# Patient Record
Sex: Male | Born: 1960
Health system: Southern US, Community
[De-identification: ages and names within clinical notes are randomized; demographics above are authoritative.]

## PROBLEM LIST (undated history)

## (undated) DIAGNOSIS — E785 Hyperlipidemia, unspecified: Secondary | ICD-10-CM

## (undated) DIAGNOSIS — I10 Essential (primary) hypertension: Secondary | ICD-10-CM

## (undated) HISTORY — DX: Hyperlipidemia, unspecified: E78.5

## (undated) HISTORY — PX: TONSILLECTOMY: SUR1361

---

## 1999-08-01 ENCOUNTER — Emergency Department (HOSPITAL_COMMUNITY): Admission: EM | Admit: 1999-08-01 | Discharge: 1999-08-01 | Payer: Self-pay | Admitting: Emergency Medicine

## 2001-12-05 ENCOUNTER — Emergency Department (HOSPITAL_COMMUNITY): Admission: EM | Admit: 2001-12-05 | Discharge: 2001-12-05 | Payer: Self-pay

## 2018-08-03 DIAGNOSIS — I10 Essential (primary) hypertension: Secondary | ICD-10-CM | POA: Diagnosis not present

## 2018-08-03 DIAGNOSIS — Z6835 Body mass index (BMI) 35.0-35.9, adult: Secondary | ICD-10-CM | POA: Diagnosis not present

## 2018-08-03 DIAGNOSIS — M25571 Pain in right ankle and joints of right foot: Secondary | ICD-10-CM | POA: Diagnosis not present

## 2018-08-03 DIAGNOSIS — T7840XA Allergy, unspecified, initial encounter: Secondary | ICD-10-CM | POA: Diagnosis not present

## 2018-08-03 DIAGNOSIS — T782XXA Anaphylactic shock, unspecified, initial encounter: Secondary | ICD-10-CM | POA: Diagnosis not present

## 2018-08-03 DIAGNOSIS — T1490XA Injury, unspecified, initial encounter: Secondary | ICD-10-CM | POA: Diagnosis not present

## 2018-08-03 DIAGNOSIS — R531 Weakness: Secondary | ICD-10-CM | POA: Diagnosis not present

## 2018-08-03 DIAGNOSIS — S8261XA Displaced fracture of lateral malleolus of right fibula, initial encounter for closed fracture: Secondary | ICD-10-CM | POA: Diagnosis not present

## 2018-08-04 DIAGNOSIS — S8264XA Nondisplaced fracture of lateral malleolus of right fibula, initial encounter for closed fracture: Secondary | ICD-10-CM | POA: Diagnosis not present

## 2018-08-05 DIAGNOSIS — S8263XA Displaced fracture of lateral malleolus of unspecified fibula, initial encounter for closed fracture: Secondary | ICD-10-CM | POA: Insufficient documentation

## 2018-08-05 NOTE — Pre-Procedure Instructions (Signed)
NEVAEH CASILLAS  08/05/2018      [facility] - [address] [place] - [address] [place]  [address] Phone: [phone] Fax: [phone]    Your procedure is scheduled on Tues., Dec. 3, 2019 from 2:48PM-4:13PM  Report to [facility] Admitting Entrance "A" at 12:45PM  Call this number if you have problems the morning of surgery:  [phone]   Remember:  Do not eat or drink after midnight on Dec. 2nd    Take these medicines the morning of surgery with A SIP OF WATER: If needed: HYDROcodone-acetaminophen (NORCO/VICODIN) and Oxymetazoline (AFRIN)   As of today, stop taking all Other Aspirin Products, Vitamins, Fish oils, and Herbal medications. Also stop all NSAIDS i.e. Advil, Ibuprofen, Motrin, Aleve, Anaprox, Naproxen, BC, Goody Powders, and all Supplements.   Do not wear jewelry..  Do not wear lotions, powders, colognes, or deodorant.  Do not shave 48 hours prior to surgery.  Men may shave face.  Do not bring valuables to the hospital.  [facility] is not responsible for any belongings or valuables.  Contacts, dentures or bridgework may not be worn into surgery.  Leave your suitcase in the car.  After surgery it may be brought to your room.  For patients admitted to the hospital, discharge time will be determined by your treatment team.  Patients discharged the day of surgery will not be allowed to drive home.   Special instructions:   [place]- Preparing For Surgery  Before surgery, you can play an important role. Because skin is not sterile, your skin needs to be as free of germs as possible. You can reduce the number of germs on your skin by washing with CHG (chlorahexidine gluconate) Soap before surgery.  CHG is an antiseptic cleaner which kills germs and bonds with the skin to continue killing germs even after washing.    Oral Hygiene is also important to reduce your risk of infection.  Remember - BRUSH YOUR TEETH THE MORNING  OF SURGERY WITH YOUR REGULAR TOOTHPASTE  Please do not use if you have an allergy to CHG or antibacterial soaps. If your skin becomes reddened/irritated stop using the CHG.  Do not shave (including legs and underarms) for at least 48 hours prior to first CHG shower. It is OK to shave your face.  Please follow these instructions carefully.   1. Shower the NIGHT BEFORE SURGERY and the MORNING OF SURGERY with CHG.   2. If you chose to wash your hair, wash your hair first as usual with your normal shampoo.  3. After you shampoo, rinse your hair and body thoroughly to remove the shampoo.  4. Use CHG as you would any other liquid soap. You can apply CHG directly to the skin and wash gently with a scrungie or a clean washcloth.   5. Apply the CHG Soap to your body ONLY FROM THE NECK DOWN.  Do not use on open wounds or open sores. Avoid contact with your eyes, ears, mouth and genitals (private parts). Wash Face and genitals (private parts)  with your normal soap.  6. Wash thoroughly, paying special attention to the area where your surgery will be performed.  7. Thoroughly rinse your body with warm water from the neck down.  8. DO NOT shower/wash with your normal soap after using and rinsing off the CHG Soap.  9. Pat yourself dry with a CLEAN TOWEL.  10. Wear CLEAN PAJAMAS to bed the night  before surgery, wear comfortable clothes the morning of surgery  11. Place CLEAN SHEETS on your bed the night of your first shower and DO NOT SLEEP WITH PETS.  Day of Surgery:  Do not apply any deodorants/lotions.  Please wear clean clothes to the hospital/surgery center.   Remember to brush your teeth WITH YOUR REGULAR TOOTHPASTE.  Please read over the following fact sheets that you were given. Pain Booklet, Coughing and Deep Breathing, MRSA Information and Surgical Site Infection Prevention

## 2018-08-07 ENCOUNTER — Other Ambulatory Visit: Payer: Self-pay

## 2018-08-07 ENCOUNTER — Encounter (HOSPITAL_COMMUNITY)
Admission: RE | Admit: 2018-08-07 | Discharge: 2018-08-07 | Disposition: A | Discharge status: Home / Self Care | Payer: BLUE CROSS/BLUE SHIELD | Source: Ambulatory Visit | Attending: Orthopedic Surgery | Admitting: Orthopedic Surgery

## 2018-08-07 ENCOUNTER — Encounter (HOSPITAL_COMMUNITY): Payer: Self-pay

## 2018-08-07 DIAGNOSIS — I10 Essential (primary) hypertension: Secondary | ICD-10-CM | POA: Diagnosis not present

## 2018-08-07 DIAGNOSIS — Z79899 Other long term (current) drug therapy: Secondary | ICD-10-CM | POA: Diagnosis not present

## 2018-08-07 DIAGNOSIS — Z01818 Encounter for other preprocedural examination: Secondary | ICD-10-CM | POA: Insufficient documentation

## 2018-08-07 LAB — CBC
HEMATOCRIT: 47 % (ref 39.0–52.0)
HEMOGLOBIN: 15.5 g/dL (ref 13.0–17.0)
MCH: 30.9 pg (ref 26.0–34.0)
MCHC: 33 g/dL (ref 30.0–36.0)
MCV: 93.6 fL (ref 80.0–100.0)
Platelets: 198 10*3/uL (ref 150–400)
RBC: 5.02 MIL/uL (ref 4.22–5.81)
RDW: 12 % (ref 11.5–15.5)
WBC: 9 10*3/uL (ref 4.0–10.5)
nRBC: 0 % (ref 0.0–0.2)

## 2018-08-07 LAB — BASIC METABOLIC PANEL
Anion gap: 9 (ref 5–15)
BUN: 17 mg/dL (ref 6–20)
CHLORIDE: 106 mmol/L (ref 98–111)
CO2: 22 mmol/L (ref 22–32)
Calcium: 9.7 mg/dL (ref 8.9–10.3)
Creatinine, Ser: 0.98 mg/dL (ref 0.61–1.24)
GFR calc Af Amer: >60 mL/min (ref >60)
GFR calc non Af Amer: >60 mL/min (ref >60)
GLUCOSE: 112 mg/dL — AB (ref 70–99)
POTASSIUM: 4.1 mmol/L (ref 3.5–5.1)
Sodium: 137 mmol/L (ref 135–145)

## 2018-08-07 LAB — SURGICAL PCR SCREEN
MRSA, PCR: NEGATIVE
Staphylococcus aureus: NEGATIVE

## 2018-08-07 NOTE — Progress Notes (Signed)
PCP - denies Cardiologist -denies   Chest x-ray - N/A EKG - 08/07/18 Stress Test - denies ECHO - denies Cardiac Cath - denies  Sleep Study - denies  Aspirin Instructions: N/A  Anesthesia review: Yes, BP elevated at PAT. Levada Dy, [place] visited with patient and explained options regarding hypertension. Resources given to patient on following up with a PCP for BP control. Dr. Stann Mainland office to be notified.   Patient denies shortness of breath, fever, cough and chest pain at PAT appointment   Patient verbalized understanding of instructions that were given to them at the PAT appointment. Patient was also instructed that they will need to review over the PAT instructions again at home before surgery.

## 2018-08-10 MED ORDER — DEXTROSE 5 % IV SOLN
3.0000 g | INTRAVENOUS | Status: AC
  Administered 2018-08-11: 3 g via INTRAVENOUS
  Filled 2018-08-10: qty 3

## 2018-08-10 NOTE — Progress Notes (Signed)
Patient states that he was started on Lisinopril 10 mg BID and that his blood pressure has been 140-170/80's.  Notified Karoline Caldwell, [place].

## 2018-08-10 NOTE — Anesthesia Preprocedure Evaluation (Addendum)
Anesthesia Evaluation  Patient identified by MRN, date of birth, ID band Patient awake    Reviewed: Allergy & Precautions, NPO status , Patient's Chart, lab work & pertinent test results  History of Anesthesia Complications Negative for: history of anesthetic complications  Airway Mallampati: II  TM Distance: >3 FB Neck ROM: Full    Dental  (+) Teeth Intact, Dental Advisory Given   Pulmonary former smoker,    Pulmonary exam normal breath sounds clear to auscultation       Cardiovascular hypertension, Pt. on medications Normal cardiovascular exam Rhythm:Regular Rate:Normal     Neuro/Psych negative neurological ROS     GI/Hepatic Neg liver ROS, GERD  Medicated,  Endo/Other  BMI 34.2  Renal/GU negative Renal ROS     Musculoskeletal negative musculoskeletal ROS (+)   Abdominal   Peds  Hematology negative hematology ROS (+)   Anesthesia Other Findings Day of surgery medications reviewed with the patient.  Reproductive/Obstetrics                           Anesthesia Physical Anesthesia Plan  ASA: III  Anesthesia Plan: General   Post-op Pain Management: GA combined w/ Regional for post-op pain   Induction: Intravenous  PONV Risk Score and Plan: 2 and Treatment may vary due to age or medical condition, Ondansetron and Dexamethasone  Airway Management Planned: LMA  Additional Equipment:   Intra-op Plan:   Post-operative Plan: Extubation in OR  Informed Consent: I have reviewed the patients History and Physical, chart, labs and discussed the procedure including the risks, benefits and alternatives for the proposed anesthesia with the patient or authorized representative who has indicated his/her understanding and acceptance.   Dental advisory given  Plan Discussed with: CRNA  Anesthesia Plan Comments: (Markedly elevated BP at PAT, no PCP, denies previous diagnosis of HTN. Dr.  Stann Mainland made aware, he stated case needs to be done soon and to leave as scheduled. Since being seen at PAT pt has been started on lisinopril 10mg  BID and reports improved BP. He was instructed to take the lisinopril on DOS. Dr. Stann Mainland made aware case may be cancelled if BP is prohibitively high.  ETT if pt position needs to be other than supine.)     Anesthesia Quick Evaluation

## 2018-08-11 ENCOUNTER — Encounter (HOSPITAL_COMMUNITY)
Admission: RE | Disposition: A | Discharge status: Home / Self Care | Payer: Self-pay | Source: Ambulatory Visit | Attending: Orthopedic Surgery

## 2018-08-11 ENCOUNTER — Observation Stay (HOSPITAL_COMMUNITY)
Admission: RE | Admit: 2018-08-11 | Discharge: 2018-08-13 | Disposition: A | Discharge status: Home / Self Care | Payer: BLUE CROSS/BLUE SHIELD | Source: Ambulatory Visit | Attending: Orthopedic Surgery | Admitting: Orthopedic Surgery

## 2018-08-11 ENCOUNTER — Encounter (HOSPITAL_COMMUNITY): Payer: Self-pay | Admitting: Urology

## 2018-08-11 ENCOUNTER — Other Ambulatory Visit: Payer: Self-pay

## 2018-08-11 ENCOUNTER — Ambulatory Visit (HOSPITAL_COMMUNITY): Payer: BLUE CROSS/BLUE SHIELD

## 2018-08-11 ENCOUNTER — Ambulatory Visit (HOSPITAL_COMMUNITY): Payer: BLUE CROSS/BLUE SHIELD | Admitting: Emergency Medicine

## 2018-08-11 ENCOUNTER — Ambulatory Visit (HOSPITAL_COMMUNITY): Payer: BLUE CROSS/BLUE SHIELD | Admitting: Anesthesiology

## 2018-08-11 ENCOUNTER — Observation Stay (HOSPITAL_COMMUNITY): Payer: BLUE CROSS/BLUE SHIELD

## 2018-08-11 DIAGNOSIS — I10 Essential (primary) hypertension: Secondary | ICD-10-CM | POA: Insufficient documentation

## 2018-08-11 DIAGNOSIS — Z79899 Other long term (current) drug therapy: Secondary | ICD-10-CM | POA: Diagnosis not present

## 2018-08-11 DIAGNOSIS — W19XXXA Unspecified fall, initial encounter: Secondary | ICD-10-CM | POA: Diagnosis not present

## 2018-08-11 DIAGNOSIS — Z23 Encounter for immunization: Secondary | ICD-10-CM | POA: Insufficient documentation

## 2018-08-11 DIAGNOSIS — Z87891 Personal history of nicotine dependence: Secondary | ICD-10-CM | POA: Diagnosis not present

## 2018-08-11 DIAGNOSIS — S82841A Displaced bimalleolar fracture of right lower leg, initial encounter for closed fracture: Principal | ICD-10-CM | POA: Insufficient documentation

## 2018-08-11 DIAGNOSIS — S93431A Sprain of tibiofibular ligament of right ankle, initial encounter: Secondary | ICD-10-CM | POA: Insufficient documentation

## 2018-08-11 DIAGNOSIS — K219 Gastro-esophageal reflux disease without esophagitis: Secondary | ICD-10-CM | POA: Insufficient documentation

## 2018-08-11 DIAGNOSIS — Z419 Encounter for procedure for purposes other than remedying health state, unspecified: Secondary | ICD-10-CM

## 2018-08-11 DIAGNOSIS — Y929 Unspecified place or not applicable: Secondary | ICD-10-CM | POA: Insufficient documentation

## 2018-08-11 DIAGNOSIS — S82891A Other fracture of right lower leg, initial encounter for closed fracture: Secondary | ICD-10-CM | POA: Diagnosis present

## 2018-08-11 DIAGNOSIS — S8261XA Displaced fracture of lateral malleolus of right fibula, initial encounter for closed fracture: Secondary | ICD-10-CM | POA: Diagnosis not present

## 2018-08-11 DIAGNOSIS — S82831D Other fracture of upper and lower end of right fibula, subsequent encounter for closed fracture with routine healing: Secondary | ICD-10-CM | POA: Diagnosis not present

## 2018-08-11 HISTORY — PX: ORIF ANKLE FRACTURE: SHX5408

## 2018-08-11 HISTORY — DX: Essential (primary) hypertension: I10

## 2018-08-11 LAB — CBC
HCT: 42.1 % (ref 39.0–52.0)
HEMOGLOBIN: 14.3 g/dL (ref 13.0–17.0)
MCH: 31.2 pg (ref 26.0–34.0)
MCHC: 34 g/dL (ref 30.0–36.0)
MCV: 91.7 fL (ref 80.0–100.0)
NRBC: 0 % (ref 0.0–0.2)
Platelets: 200 10*3/uL (ref 150–400)
RBC: 4.59 MIL/uL (ref 4.22–5.81)
RDW: 11.7 % (ref 11.5–15.5)
WBC: 11.6 10*3/uL — ABNORMAL HIGH (ref 4.0–10.5)

## 2018-08-11 LAB — CREATININE, SERUM
Creatinine, Ser: 0.87 mg/dL (ref 0.61–1.24)
GFR calc non Af Amer: >60 mL/min (ref >60)

## 2018-08-11 SURGERY — OPEN REDUCTION INTERNAL FIXATION (ORIF) ANKLE FRACTURE
Anesthesia: General | Site: Ankle | Laterality: Right

## 2018-08-11 MED ORDER — BUPIVACAINE HCL (PF) 0.25 % IJ SOLN
INTRAMUSCULAR | Status: AC
  Filled 2018-08-11: qty 30

## 2018-08-11 MED ORDER — ENSURE ENLIVE PO LIQD
237.0000 mL | Freq: Two times a day (BID) | ORAL | Status: DC
  Administered 2018-08-12 – 2018-08-13 (×4): 237 mL via ORAL

## 2018-08-11 MED ORDER — HYDRALAZINE HCL 20 MG/ML IJ SOLN: 10.0000 mg | Freq: Three times a day (TID) | INTRAMUSCULAR | Status: DC | PRN

## 2018-08-11 MED ORDER — BUPIVACAINE HCL (PF) 0.25 % IJ SOLN: INTRAMUSCULAR | Status: DC | PRN

## 2018-08-11 MED ORDER — PROPOFOL 10 MG/ML IV BOLUS
INTRAVENOUS | Status: DC | PRN
  Administered 2018-08-11: 200 mg via INTRAVENOUS

## 2018-08-11 MED ORDER — ACETAMINOPHEN 650 MG RE SUPP: 650.0000 mg | Freq: Four times a day (QID) | RECTAL | Status: DC | PRN

## 2018-08-11 MED ORDER — FENTANYL CITRATE (PF) 100 MCG/2ML IJ SOLN
50.0000 ug | INTRAMUSCULAR | Status: AC | PRN
  Administered 2018-08-11 (×2): 50 ug via INTRAVENOUS
  Filled 2018-08-11 (×2): qty 1

## 2018-08-11 MED ORDER — ONDANSETRON HCL 4 MG/2ML IJ SOLN
INTRAMUSCULAR | Status: DC | PRN
  Administered 2018-08-11: 4 mg via INTRAVENOUS

## 2018-08-11 MED ORDER — OXYCODONE HCL 5 MG/5ML PO SOLN: 5.0000 mg | Freq: Once | ORAL | Status: DC | PRN

## 2018-08-11 MED ORDER — 0.9 % SODIUM CHLORIDE (POUR BTL) OPTIME
TOPICAL | Status: DC | PRN
  Administered 2018-08-11: 1000 mL

## 2018-08-11 MED ORDER — LIDOCAINE 2% (20 MG/ML) 5 ML SYRINGE
INTRAMUSCULAR | Status: AC
  Filled 2018-08-11: qty 5

## 2018-08-11 MED ORDER — PROPOFOL 10 MG/ML IV BOLUS
INTRAVENOUS | Status: AC
  Filled 2018-08-11: qty 20

## 2018-08-11 MED ORDER — BUPIVACAINE-EPINEPHRINE (PF) 0.5% -1:200000 IJ SOLN
INTRAMUSCULAR | Status: DC | PRN
  Administered 2018-08-11: 10 mL via PERINEURAL
  Administered 2018-08-11: 20 mL via PERINEURAL

## 2018-08-11 MED ORDER — PHENYLEPHRINE 40 MCG/ML (10ML) SYRINGE FOR IV PUSH (FOR BLOOD PRESSURE SUPPORT)
PREFILLED_SYRINGE | INTRAVENOUS | Status: DC | PRN
  Administered 2018-08-11 (×2): 120 ug via INTRAVENOUS

## 2018-08-11 MED ORDER — ONDANSETRON HCL 4 MG/2ML IJ SOLN
INTRAMUSCULAR | Status: AC
  Filled 2018-08-11: qty 2

## 2018-08-11 MED ORDER — FENTANYL CITRATE (PF) 100 MCG/2ML IJ SOLN
INTRAMUSCULAR | Status: DC | PRN
  Administered 2018-08-11 (×3): 50 ug via INTRAVENOUS

## 2018-08-11 MED ORDER — ONDANSETRON HCL 4 MG/2ML IJ SOLN: 4.0000 mg | Freq: Four times a day (QID) | INTRAMUSCULAR | Status: DC | PRN

## 2018-08-11 MED ORDER — INFLUENZA VAC SPLIT QUAD 0.5 ML IM SUSY
0.5000 mL | PREFILLED_SYRINGE | INTRAMUSCULAR | Status: AC
  Administered 2018-08-12: 0.5 mL via INTRAMUSCULAR
  Filled 2018-08-11: qty 0.5

## 2018-08-11 MED ORDER — PROMETHAZINE HCL 25 MG/ML IJ SOLN: 6.2500 mg | INTRAMUSCULAR | Status: DC | PRN

## 2018-08-11 MED ORDER — GABAPENTIN 600 MG PO TABS
300.0000 mg | ORAL_TABLET | Freq: Three times a day (TID) | ORAL | Status: DC
  Administered 2018-08-11 – 2018-08-13 (×5): 300 mg via ORAL
  Filled 2018-08-11 (×5): qty 1

## 2018-08-11 MED ORDER — PANTOPRAZOLE SODIUM 40 MG PO TBEC
40.0000 mg | DELAYED_RELEASE_TABLET | Freq: Every day | ORAL | Status: DC
  Administered 2018-08-11 – 2018-08-13 (×3): 40 mg via ORAL
  Filled 2018-08-11 (×3): qty 1

## 2018-08-11 MED ORDER — MIDAZOLAM HCL 2 MG/2ML IJ SOLN
INTRAMUSCULAR | Status: AC
  Administered 2018-08-11: 1 mg via INTRAVENOUS
  Filled 2018-08-11: qty 2

## 2018-08-11 MED ORDER — METHOCARBAMOL 500 MG PO TABS
500.0000 mg | ORAL_TABLET | Freq: Four times a day (QID) | ORAL | Status: DC | PRN
  Administered 2018-08-12 – 2018-08-13 (×2): 500 mg via ORAL
  Filled 2018-08-11 (×2): qty 1

## 2018-08-11 MED ORDER — MIDAZOLAM HCL 2 MG/2ML IJ SOLN
1.0000 mg | Freq: Once | INTRAMUSCULAR | Status: AC
  Administered 2018-08-11: 1 mg via INTRAVENOUS

## 2018-08-11 MED ORDER — HYDROMORPHONE HCL 1 MG/ML IJ SOLN
1.0000 mg | INTRAMUSCULAR | Status: DC | PRN
  Administered 2018-08-13: 1 mg via INTRAVENOUS
  Filled 2018-08-11: qty 1

## 2018-08-11 MED ORDER — FENTANYL CITRATE (PF) 100 MCG/2ML IJ SOLN
INTRAMUSCULAR | Status: AC
  Administered 2018-08-11: 50 ug via INTRAVENOUS
  Filled 2018-08-11: qty 2

## 2018-08-11 MED ORDER — PHENYLEPHRINE 40 MCG/ML (10ML) SYRINGE FOR IV PUSH (FOR BLOOD PRESSURE SUPPORT)
PREFILLED_SYRINGE | INTRAVENOUS | Status: AC
  Filled 2018-08-11: qty 10

## 2018-08-11 MED ORDER — DEXAMETHASONE SODIUM PHOSPHATE 10 MG/ML IJ SOLN
INTRAMUSCULAR | Status: AC
  Filled 2018-08-11: qty 1

## 2018-08-11 MED ORDER — MIDAZOLAM HCL 2 MG/2ML IJ SOLN
INTRAMUSCULAR | Status: AC
  Filled 2018-08-11: qty 2

## 2018-08-11 MED ORDER — OXYCODONE HCL 5 MG PO TABS
5.0000 mg | ORAL_TABLET | ORAL | Status: DC | PRN
  Administered 2018-08-12 – 2018-08-13 (×3): 10 mg via ORAL
  Filled 2018-08-11 (×3): qty 2

## 2018-08-11 MED ORDER — METHOCARBAMOL 1000 MG/10ML IJ SOLN
500.0000 mg | Freq: Four times a day (QID) | INTRAVENOUS | Status: DC | PRN
  Filled 2018-08-11: qty 5

## 2018-08-11 MED ORDER — ACETAMINOPHEN 10 MG/ML IV SOLN: 1000.0000 mg | Freq: Once | INTRAVENOUS | Status: DC | PRN

## 2018-08-11 MED ORDER — LISINOPRIL 10 MG PO TABS
10.0000 mg | ORAL_TABLET | Freq: Two times a day (BID) | ORAL | Status: DC
  Administered 2018-08-11 – 2018-08-13 (×4): 10 mg via ORAL
  Filled 2018-08-11 (×4): qty 1

## 2018-08-11 MED ORDER — CELECOXIB 200 MG PO CAPS
200.0000 mg | ORAL_CAPSULE | Freq: Two times a day (BID) | ORAL | Status: DC
  Administered 2018-08-11 – 2018-08-13 (×4): 200 mg via ORAL
  Filled 2018-08-11 (×4): qty 1

## 2018-08-11 MED ORDER — LIDOCAINE HCL (CARDIAC) PF 100 MG/5ML IV SOSY
PREFILLED_SYRINGE | INTRAVENOUS | Status: DC | PRN
  Administered 2018-08-11: 100 mg via INTRAVENOUS

## 2018-08-11 MED ORDER — FENTANYL CITRATE (PF) 250 MCG/5ML IJ SOLN
INTRAMUSCULAR | Status: AC
  Filled 2018-08-11: qty 5

## 2018-08-11 MED ORDER — CHLORHEXIDINE GLUCONATE 4 % EX LIQD: 60.0000 mL | Freq: Once | CUTANEOUS | Status: DC

## 2018-08-11 MED ORDER — OXYCODONE HCL 5 MG PO TABS: 5.0000 mg | ORAL_TABLET | Freq: Once | ORAL | Status: DC | PRN

## 2018-08-11 MED ORDER — ENOXAPARIN SODIUM 30 MG/0.3ML ~~LOC~~ SOLN
30.0000 mg | SUBCUTANEOUS | Status: DC
  Administered 2018-08-13: 30 mg via SUBCUTANEOUS
  Filled 2018-08-11 (×2): qty 0.3

## 2018-08-11 MED ORDER — FENTANYL CITRATE (PF) 100 MCG/2ML IJ SOLN: 25.0000 ug | INTRAMUSCULAR | Status: DC | PRN

## 2018-08-11 MED ORDER — ACETAMINOPHEN 325 MG PO TABS: 650.0000 mg | ORAL_TABLET | Freq: Four times a day (QID) | ORAL | Status: DC | PRN

## 2018-08-11 MED ORDER — DEXAMETHASONE SODIUM PHOSPHATE 10 MG/ML IJ SOLN
INTRAMUSCULAR | Status: DC | PRN
  Administered 2018-08-11: 10 mg via INTRAVENOUS

## 2018-08-11 MED ORDER — ACETAMINOPHEN 500 MG PO TABS
1000.0000 mg | ORAL_TABLET | Freq: Four times a day (QID) | ORAL | Status: DC
  Administered 2018-08-11 – 2018-08-13 (×6): 1000 mg via ORAL
  Filled 2018-08-11 (×8): qty 2

## 2018-08-11 MED ORDER — SODIUM CHLORIDE 0.9 % IV SOLN
INTRAVENOUS | Status: DC | PRN
  Administered 2018-08-11: 25 ug/min via INTRAVENOUS

## 2018-08-11 MED ORDER — ONDANSETRON HCL 4 MG PO TABS: 4.0000 mg | ORAL_TABLET | Freq: Four times a day (QID) | ORAL | Status: DC | PRN

## 2018-08-11 MED ORDER — LACTATED RINGERS IV SOLN
INTRAVENOUS | Status: DC
  Administered 2018-08-11 (×3): via INTRAVENOUS

## 2018-08-11 MED ORDER — FENTANYL CITRATE (PF) 100 MCG/2ML IJ SOLN
INTRAMUSCULAR | Status: AC
  Filled 2018-08-11: qty 2

## 2018-08-11 SURGICAL SUPPLY — 62 items
ALCOHOL 70% 16 OZ (MISCELLANEOUS) ×3 IMPLANT
BANDAGE ACE 4X5 VEL STRL LF (GAUZE/BANDAGES/DRESSINGS) ×2 IMPLANT
BANDAGE ACE 6X5 VEL STRL LF (GAUZE/BANDAGES/DRESSINGS) ×2 IMPLANT
BANDAGE ESMARK 6X9 LF (GAUZE/BANDAGES/DRESSINGS) IMPLANT
BIT DRILL 2.5 CANN LNG (BIT) ×2 IMPLANT
BIT DRILL 3.5 CANN STRL (BIT) ×2 IMPLANT
BNDG CMPR 9X6 STRL LF SNTH (GAUZE/BANDAGES/DRESSINGS) ×1
BNDG COHESIVE 4X5 TAN STRL (GAUZE/BANDAGES/DRESSINGS) ×3 IMPLANT
BNDG ESMARK 6X9 LF (GAUZE/BANDAGES/DRESSINGS) ×3
COVER SURGICAL LIGHT HANDLE (MISCELLANEOUS) ×3 IMPLANT
COVER WAND RF STERILE (DRAPES) ×3 IMPLANT
CUFF TOURNIQUET SINGLE 34IN LL (TOURNIQUET CUFF) ×2 IMPLANT
DRAPE C-ARM 42X72 X-RAY (DRAPES) ×3 IMPLANT
DRAPE C-ARMOR (DRAPES) ×3 IMPLANT
DRAPE U-SHAPE 47X51 STRL (DRAPES) ×3 IMPLANT
DRSG ADAPTIC 3X8 NADH LF (GAUZE/BANDAGES/DRESSINGS) ×3 IMPLANT
DRSG PAD ABDOMINAL 8X10 ST (GAUZE/BANDAGES/DRESSINGS) ×3 IMPLANT
DURAPREP 26ML APPLICATOR (WOUND CARE) ×5 IMPLANT
ELECT REM PT RETURN 9FT ADLT (ELECTROSURGICAL) ×3
ELECTRODE REM PT RTRN 9FT ADLT (ELECTROSURGICAL) ×1 IMPLANT
GAUZE SPONGE 4X4 12PLY STRL (GAUZE/BANDAGES/DRESSINGS) ×3 IMPLANT
GAUZE XEROFORM 1X8 LF (GAUZE/BANDAGES/DRESSINGS) ×2 IMPLANT
GLOVE BIO SURGEON STRL SZ7.5 (GLOVE) ×3 IMPLANT
GLOVE BIOGEL PI IND STRL 8 (GLOVE) ×1 IMPLANT
GLOVE BIOGEL PI INDICATOR 8 (GLOVE) ×2
GOWN STRL REUS W/ TWL LRG LVL3 (GOWN DISPOSABLE) ×2 IMPLANT
GOWN STRL REUS W/ TWL XL LVL3 (GOWN DISPOSABLE) ×1 IMPLANT
GOWN STRL REUS W/TWL LRG LVL3 (GOWN DISPOSABLE) ×6
GOWN STRL REUS W/TWL XL LVL3 (GOWN DISPOSABLE) ×6
IMPL TIGHTROP W/DRV K-LESS (Anchor) IMPLANT
IMPLANT TIGHTROPE W/DRV K-LESS (Anchor) ×3 IMPLANT
KIT BASIN OR (CUSTOM PROCEDURE TRAY) ×3 IMPLANT
KIT TURNOVER KIT B (KITS) ×3 IMPLANT
NDL HYPO 25GX1X1/2 BEV (NEEDLE) IMPLANT
NEEDLE HYPO 25GX1X1/2 BEV (NEEDLE) ×3 IMPLANT
NS IRRIG 1000ML POUR BTL (IV SOLUTION) ×3 IMPLANT
PACK ORTHO EXTREMITY (CUSTOM PROCEDURE TRAY) ×3 IMPLANT
PAD ABD 8X10 STRL (GAUZE/BANDAGES/DRESSINGS) ×4 IMPLANT
PAD ARMBOARD 7.5X6 YLW CONV (MISCELLANEOUS) ×6 IMPLANT
PAD CAST 4YDX4 CTTN HI CHSV (CAST SUPPLIES) ×2 IMPLANT
PADDING CAST COTTON 4X4 STRL (CAST SUPPLIES) ×6
PADDING CAST COTTON 6X4 STRL (CAST SUPPLIES) ×3 IMPLANT
PLATE THIRD TUBULAR 7 HOLE (Plate) ×2 IMPLANT
SCREW CANC T15 FT 16X4XST (Screw) IMPLANT
SCREW CANCELLOUS 4.0X16MM (Screw) ×3 IMPLANT
SCREW LOW PROFILE 3.5X14 (Screw) ×2 IMPLANT
SCREW NON LOCKING 3.5X26 ANKLE (Screw) ×2 IMPLANT
SCREW NON LOCKING 4.0X20 (Screw) ×2 IMPLANT
SCREW NON-LOCKING 3.5X12MM (Screw) ×4 IMPLANT
SPONGE LAP 18X18 X RAY DECT (DISPOSABLE) ×2 IMPLANT
SUCTION FRAZIER HANDLE 10FR (MISCELLANEOUS) ×2
SUCTION TUBE FRAZIER 10FR DISP (MISCELLANEOUS) ×1 IMPLANT
SUT ETHILON 3 0 PS 1 (SUTURE) ×5 IMPLANT
SUT VIC AB 0 CT1 27 (SUTURE) ×3
SUT VIC AB 0 CT1 27XBRD ANBCTR (SUTURE) IMPLANT
SUT VIC AB 2-0 CT1 27 (SUTURE) ×3
SUT VIC AB 2-0 CT1 TAPERPNT 27 (SUTURE) ×1 IMPLANT
SYR CONTROL 10ML LL (SYRINGE) ×2 IMPLANT
TOWEL OR 17X26 10 PK STRL BLUE (TOWEL DISPOSABLE) ×4 IMPLANT
TUBE CONNECTING 12'X1/4 (SUCTIONS) ×1
TUBE CONNECTING 12X1/4 (SUCTIONS) ×2 IMPLANT
YANKAUER SUCT BULB TIP NO VENT (SUCTIONS) ×3 IMPLANT

## 2018-08-11 NOTE — Anesthesia Procedure Notes (Signed)
Anesthesia Regional Block: Popliteal block   Pre-Anesthetic Checklist: ,, timeout performed, Correct Patient, Correct Site, Correct Laterality, Correct Procedure, Correct Position, site marked, Risks and benefits discussed, pre-op evaluation,  At surgeon's request and post-op pain management  Laterality: Right  Prep: Maximum Sterile Barrier Precautions used, chloraprep       Needles:  Injection technique: Single-shot  Needle Type: Echogenic Stimulator Needle     Needle Length: 9cm  Needle Gauge: 22     Additional Needles:   Procedures:,,,, ultrasound used (permanent image in chart),,,,  Narrative:  Start time: 08/11/2018 1:41 PM End time: 08/11/2018 1:43 PM Injection made incrementally with aspirations every 5 mL.  Performed by: Personally  Anesthesiologist: Brennan Bailey, MD  Additional Notes: Risks, benefits, and alternative discussed. Patient gave consent for procedure. Patient prepped and draped in sterile fashion. Sedation administered, patient remains easily responsive to voice. Relevant anatomy identified with ultrasound guidance. Local anesthetic given in 5cc increments with no signs or symptoms of intravascular injection. No pain or paraesthesias with injection. Patient monitored throughout procedure with signs of LAST or immediate complications. Tolerated well. Ultrasound image placed in chart.  Tawny Asal, MD

## 2018-08-11 NOTE — Anesthesia Procedure Notes (Signed)
Procedure Name: LMA Insertion Date/Time: 08/11/2018 2:49 PM Performed by: Jenne Campus, CRNA Pre-anesthesia Checklist: Patient identified, Emergency Drugs available, Suction available and Patient being monitored Patient Re-evaluated:Patient Re-evaluated prior to induction Oxygen Delivery Method: Circle System Utilized Preoxygenation: Pre-oxygenation with 100% oxygen Induction Type: IV induction Ventilation: Mask ventilation without difficulty LMA: LMA inserted LMA Size: 4.0 Number of attempts: 1 Airway Equipment and Method: Bite block Placement Confirmation: positive ETCO2 and breath sounds checked- equal and bilateral Tube secured with: Tape Dental Injury: Teeth and Oropharynx as per pre-operative assessment

## 2018-08-11 NOTE — Op Note (Signed)
Date of Surgery: 08/11/2018  INDICATIONS: Adrian Chaney is a 57 y.o.-year-old male who sustained a right ankle fracture; he was indicated for open reduction and internal fixation due to the displaced nature of the articular fracture and came to the operating room today for this procedure. The patient did consent to the procedure after discussion of the risks and benefits.  PREOPERATIVE DIAGNOSIS: right bimalleolar equivalent ankle fracture  POSTOPERATIVE DIAGNOSIS: Same.  PROCEDURE: 1. Open treatment of right ankle fracture with internal fixation. Lateral malleolar CPT [id number]; 2. Open reduction and internal fixation of distal tibia-fibular joint (syndesmosis), left 3. Intraoperative fluoroscopy 3 views with stress radiography.  SURGEON: Christropher Gintz P. Stann Mainland, M.D.  ASSIST: none.  ANESTHESIA:  general, popliteal block  TOURNIQUET TIME: 51 min. At 350 mm Hg  IV FLUIDS AND URINE: See anesthesia.  ESTIMATED BLOOD LOSS: 5 mL.  IMPLANTS:  Arthrex stainless steele 1/3 tubular 7 hole plate with cancellous and cortical screws One tightrope device for syndesmosis fixation, Arthrex  COMPLICATIONS: None.  DESCRIPTION OF PROCEDURE: The patient was brought to the operating room and placed supine on the operating table.  The patient had been signed prior to the procedure and this was documented. The patient had the anesthesia placed by the anesthesiologist.  A nonsterile tourniquet was placed on the upper thigh.  The prep verification and incision time-outs were performed to confirm that this was the correct patient, site, side and location. The patient had an SCD on the opposite lower extremity. The patient did receive antibiotics prior to the incision and was re-dosed during the procedure as needed at indicated intervals.  The patient had the lower extremity prepped and draped in the standard surgical fashion.  The extremity was exsanguinated using an esmarch bandage and the tourniquet was inflated to 350  mm Hg.   Incision was made over the distal fibula and the fracture was exposed and reduced anatomically with a clamp. A lag screw was placed. I then applied a 1/3 tubular locking plate and secured it proximally and distally with non-locking screws. Bone quality was good. I used c-arm to confirm satisfactory reduction and fixation.    The syndesmosis was stressed using live fluoroscopy and found to be un-stable.   We then moved to fix the syndesmosis.  Utlizing the mortise ankle xray the heel was bumped up off the bed and ankle flexed to neutral dorsiflexion.  The [facility] clamp was applied to the lateral plate and the medial malleolus.  This was tightened and confirmed to have held the syndesmosis reduced.  Following this, we moved to drill across the distal fibula and both cortices of the distal tibia.  This was done parallel to the joint line with the syndesmosis held reduced.  Next, we placed the tight rope device and flipped the medial button against the cortex of the medial tibia.  This was hand tightened again while elevating the heel off the table and monitoring for maintenance of the reduction.    Following this fixation final AP, Lateral and Mortise radiographs were taken and inerpreted by myself to confirm adequate reduction and placement of hardware.  The wounds were irrigated, and closed with vicryl with routine closure for the skin. The wounds were injected with local anesthetic. Sterile gauze was applied followed by a posterior splint. He was awakened and returned to the PACU in stable and satisfactory condition. There were no complications.    POSTOPERATIVE PLAN: Mr. Adrian Chaney will remain nonweightbearing on this leg for approximately 8 weeks; Mr.  Adrian Chaney will return for suture removal in 2 weeks.  He will be immobilized in a short leg splint and then transitioned to a CAM walker at his first follow up appointment.  Mr. Adrian Chaney will receive DVT prophylaxis based on other medications, activity  level, and risk ratio of bleeding to thrombosis.  Geralynn Rile, MD [facility] [phone] 4:13 PM

## 2018-08-11 NOTE — Brief Op Note (Signed)
08/11/2018  4:12 PM  PATIENT:  Adrian Chaney  57 y.o. male  PRE-OPERATIVE DIAGNOSIS:  Right ankle distal fibula fracture  POST-OPERATIVE DIAGNOSIS:  Right ankle distal fibula fracture  PROCEDURE:  Procedure(s) with comments: Open reduction internal fixation right ankle with possible syndesmosis fixation (Right) - 90 mins- OK to put to follow Stann Mainland per April  SURGEON:  Surgeon(s) and Role:    * Stann Mainland, Elly Modena, MD - Primary  PHYSICIAN ASSISTANT:   ASSISTANTS: none   ANESTHESIA:   regional and general  EBL:  5 mL   BLOOD ADMINISTERED:none  DRAINS: none   LOCAL MEDICATIONS USED:  NONE  SPECIMEN:  No Specimen  DISPOSITION OF SPECIMEN:  N/A  COUNTS:  YES  TOURNIQUET:   Total Tourniquet Time Documented: Thigh (Right) - 52 minutes Total: Thigh (Right) - 52 minutes   DICTATION: .Note written in EPIC  PLAN OF CARE: Admit for overnight observation  PATIENT DISPOSITION:  PACU - hemodynamically stable.   Delay start of Pharmacological VTE agent (>24hrs) due to surgical blood loss or risk of bleeding: not applicable

## 2018-08-11 NOTE — Anesthesia Postprocedure Evaluation (Signed)
Anesthesia Post Note  Patient: Adrian Chaney  Procedure(s) Performed: Open reduction internal fixation right ankle with possible syndesmosis fixation (Right Ankle)     Patient location during evaluation: PACU Anesthesia Type: General Level of consciousness: awake and alert Pain management: pain level controlled Vital Signs Assessment: post-procedure vital signs reviewed and stable Respiratory status: spontaneous breathing, nonlabored ventilation and respiratory function stable Cardiovascular status: blood pressure returned to baseline and stable Postop Assessment: no apparent nausea or vomiting Anesthetic complications: no    Last Vitals:  Vitals:   08/11/18 1625 08/11/18 1640  BP: (!) 178/89 (!) 161/84  Pulse: 80 87  Resp: 18 (!) 33  Temp: 36.8 C   SpO2: 97% 90%    Last Pain:  Vitals:   08/11/18 1625  TempSrc:   PainSc: 0-No pain                 Brennan Bailey

## 2018-08-11 NOTE — H&P (Signed)
ORTHOPAEDIC H and P  REQUESTING PHYSICIAN: Nicholes Stairs, MD  PCP:  Patient, No Pcp Per  Chief Complaint: right ankle fracture  HPI: Adrian Chaney is a 57 y.o. male who complains of right ankle pain following a fall last Tuesday morning.  He presents today for open reduction and internal fixation of the right ankle.  He complains of pain in the ankle but otherwise no numbness or tingling.  He has been dealing with some hypertension and has seen his PCP since our last office visit.  He states that he has been started on lisinopril.  He has no headache or vision changes to report today.  Past Medical History:  Diagnosis Date  . Hypertension    Past Surgical History:  Procedure Laterality Date  . TONSILLECTOMY     Social History   Socioeconomic History  . Marital status: Single    Spouse name: Not on file  . Number of children: Not on file  . Years of education: Not on file  . Highest education level: Not on file  Occupational History  . Not on file  Social Needs  . Financial resource strain: Not on file  . Food insecurity:    Worry: Not on file    Inability: Not on file  . Transportation needs:    Medical: Not on file    Non-medical: Not on file  Tobacco Use  . Smoking status: Former Research scientist (life sciences)  . Smokeless tobacco: Never Used  Substance and Sexual Activity  . Alcohol use: Not Currently    Alcohol/week: 1.0 - 2.0 standard drinks    Types: 1 - 2 Cans of beer per week  . Drug use: Yes    Frequency: 1.0 times per week    Types: Marijuana    Comment: 1 joint a week.   Marland Kitchen Sexual activity: Not on file  Lifestyle  . Physical activity:    Days per week: Not on file    Minutes per session: Not on file  . Stress: Not on file  Relationships  . Social connections:    Talks on phone: Not on file    Gets together: Not on file    Attends religious service: Not on file    Active member of club or organization: Not on file    Attends meetings of clubs or organizations:  Not on file    Relationship status: Not on file  Other Topics Concern  . Not on file  Social History Narrative  . Not on file   History reviewed. No pertinent family history. Allergies  Allergen Reactions  . Hydrocodone Nausea Only   Prior to Admission medications   Medication Sig Start Date End Date Taking? Authorizing Provider  Glucosamine Sulfate (FP GLUCOSAMINE PO) Take 1 capsule by mouth every morning.   Yes [provider]  HYDROcodone-acetaminophen (NORCO/VICODIN) 5-325 MG tablet Take 1 tablet by mouth every 6 (six) hours as needed for pain. 08/03/18  Yes [provider]  ibuprofen (ADVIL,MOTRIN) 200 MG tablet Take 600 mg by mouth every 6 (six) hours as needed (pain).   Yes [provider]  lisinopril (PRINIVIL,ZESTRIL) 10 MG tablet Take 10 mg by mouth 2 (two) times daily.   Yes [provider]  Multiple Vitamin (MULTIVITAMIN WITH MINERALS) TABS tablet Take 2 tablets by mouth daily.   Yes [provider]  Omega-3 Fatty Acids (FISH OIL) 1000 MG CAPS Take 1,000 mg by mouth 2 (two) times daily.   Yes [provider]  omeprazole (PRILOSEC) 10 MG capsule Take 10 mg by mouth daily.   Yes [provider]  OVER THE COUNTER MEDICATION Take 2 capsules by mouth every morning. Slim Tipton (speeds metabolism up)   Yes [provider]  oxymetazoline (AFRIN) 0.05 % nasal spray Place 1 spray into both nostrils daily as needed for congestion.   Yes [provider]  tetrahydrozoline-zinc (VISINE-AC) 0.05-0.25 % ophthalmic solution Place 2 drops into both eyes 3 (three) times daily as needed (dry eyes).   Yes [provider]   No results found.  Positive ROS: All other systems have been reviewed and were otherwise negative with the exception of those mentioned in the HPI and as above.  Physical Exam: General: Alert, no acute distress Cardiovascular: No pedal edema Respiratory: No cyanosis, no use of accessory  musculature GI: No organomegaly, abdomen is soft and non-tender Skin: No lesions in the area of chief complaint Neurologic: Sensation intact distally Psychiatric: Patient is competent for consent with normal mood and affect Lymphatic: No axillary or cervical lymphadenopathy    Assessment: Closed right ankle fracture.  Plan: -Plan for open reduction internal fixation of the right ankle today.  We again reviewed the risk, benefits, and indications of this procedure at length.  These include but are not limited to bleeding, infection, damage to surrounding neurovascular structures, development of blood clots, nonunion, malunion, painful hardware, need for further surgery, and the risk of anesthesia.  He has provided informed consent.  -Regarding his hypertension.  We will continue to monitor this preoperatively.  I will defer to the anesthesiologist as to his fitness for surgery.  -From a postoperative standpoint he will be admitted into the observation status postoperatively for routine elevation, physical therapy, and pain control.  He will discharge home tomorrow.    Nicholes Stairs, MD Cell [phone]    08/11/2018 12:20 PM

## 2018-08-11 NOTE — Discharge Instructions (Signed)
Postop instructions: -He should remain nonweightbearing to the right lower extremity.  He should elevate the right lower extremity with your "toes above nose."  At all times.  -Maintain your postoperative splint with your bandages clean and dry at all times.  -For the prevention of blood clots take a 325 mg aspirin once daily for 6 weeks postoperatively.  -For pain take Tylenol and Advil as needed every 6-8 hours.  He should also take the gabapentin 3 times a day for pain control.  For breakthrough pain take oxycodone as directed.  For nausea and/or vomiting take Zofran as directed.  -Return to see Dr. Stann Mainland in 2 weeks for wound check and splint exchange.

## 2018-08-11 NOTE — Anesthesia Procedure Notes (Signed)
Anesthesia Regional Block: Adductor canal block   Pre-Anesthetic Checklist: ,, timeout performed, Correct Patient, Correct Site, Correct Laterality, Correct Procedure, Correct Position, site marked, Risks and benefits discussed, pre-op evaluation,  At surgeon's request and post-op pain management  Laterality: Right  Prep: Maximum Sterile Barrier Precautions used, chloraprep       Needles:  Injection technique: Single-shot  Needle Type: Echogenic Stimulator Needle     Needle Length: 9cm  Needle Gauge: 22     Additional Needles:   Procedures:,,,, ultrasound used (permanent image in chart),,,,  Narrative:  Start time: 08/11/2018 1:39 PM End time: 08/11/2018 1:41 PM Injection made incrementally with aspirations every 5 mL.  Performed by: Personally  Anesthesiologist: Brennan Bailey, MD  Additional Notes: Risks, benefits, and alternative discussed. Patient gave consent for procedure. Patient prepped and draped in sterile fashion. Sedation administered, patient remains easily responsive to voice. Relevant anatomy identified with ultrasound guidance. Local anesthetic given in 5cc increments with no signs or symptoms of intravascular injection. No pain or paraesthesias with injection. Patient monitored throughout procedure with signs of LAST or immediate complications. Tolerated well. Ultrasound image placed in chart.  Tawny Asal, MD

## 2018-08-11 NOTE — Transfer of Care (Signed)
Immediate Anesthesia Transfer of Care Note  Patient: Adrian Chaney  Procedure(s) Performed: Open reduction internal fixation right ankle with possible syndesmosis fixation (Right Ankle)  Patient Location: PACU  Anesthesia Type:General  Level of Consciousness: awake, oriented and patient cooperative  Airway & Oxygen Therapy: Patient Spontanous Breathing and Patient connected to nasal cannula oxygen  Post-op Assessment: Report given to RN and Post -op Vital signs reviewed and stable  Post vital signs: Reviewed  Last Vitals:  Vitals Value Taken Time  BP    Temp    Pulse 83 08/11/2018  4:18 PM  Resp 15 08/11/2018  4:18 PM  SpO2 98 % 08/11/2018  4:18 PM  Vitals shown include unvalidated device data.  Last Pain:  Vitals:   08/11/18 1350  TempSrc:   PainSc: 0-No pain         Complications: No apparent anesthesia complications

## 2018-08-12 ENCOUNTER — Encounter (HOSPITAL_COMMUNITY): Payer: Self-pay | Admitting: Orthopedic Surgery

## 2018-08-12 DIAGNOSIS — S93431A Sprain of tibiofibular ligament of right ankle, initial encounter: Secondary | ICD-10-CM | POA: Diagnosis not present

## 2018-08-12 DIAGNOSIS — Z23 Encounter for immunization: Secondary | ICD-10-CM | POA: Diagnosis not present

## 2018-08-12 DIAGNOSIS — K219 Gastro-esophageal reflux disease without esophagitis: Secondary | ICD-10-CM | POA: Diagnosis not present

## 2018-08-12 DIAGNOSIS — Y929 Unspecified place or not applicable: Secondary | ICD-10-CM | POA: Diagnosis not present

## 2018-08-12 DIAGNOSIS — S82841A Displaced bimalleolar fracture of right lower leg, initial encounter for closed fracture: Secondary | ICD-10-CM | POA: Diagnosis not present

## 2018-08-12 DIAGNOSIS — Z87891 Personal history of nicotine dependence: Secondary | ICD-10-CM | POA: Diagnosis not present

## 2018-08-12 DIAGNOSIS — Z79899 Other long term (current) drug therapy: Secondary | ICD-10-CM | POA: Diagnosis not present

## 2018-08-12 DIAGNOSIS — I10 Essential (primary) hypertension: Secondary | ICD-10-CM | POA: Diagnosis not present

## 2018-08-12 DIAGNOSIS — W19XXXA Unspecified fall, initial encounter: Secondary | ICD-10-CM | POA: Diagnosis not present

## 2018-08-12 LAB — HIV ANTIBODY (ROUTINE TESTING W REFLEX): HIV Screen 4th Generation wRfx: NONREACTIVE

## 2018-08-12 NOTE — Plan of Care (Signed)
  Problem: Clinical Measurements: Goal: Ability to maintain clinical measurements within normal limits will improve Outcome: Progressing   Problem: Clinical Measurements: Goal: Will remain free from infection Outcome: Progressing   Problem: Pain Managment: Goal: General experience of comfort will improve Outcome: Progressing   Problem: Safety: Goal: Ability to remain free from injury will improve Outcome: Progressing   Problem: Skin Integrity: Goal: Risk for impaired skin integrity will decrease Outcome: Progressing

## 2018-08-12 NOTE — Plan of Care (Signed)
Problem: Education: Goal: Knowledge of General Education information will improve Description Including pain rating scale, medication(s)/side effects and non-pharmacologic comfort measures Outcome: Progressing   Problem: Health Behavior/Discharge Planning: Goal: Ability to manage health-related needs will improve Outcome: Progressing   Problem: Clinical Measurements: Goal: Respiratory complications will improve Outcome: Progressing   Problem: Nutrition: Goal: Adequate nutrition will be maintained Outcome: Progressing   Problem: Coping: Goal: Level of anxiety will decrease Outcome: Progressing   Problem: Pain Managment: Goal: General experience of comfort will improve Outcome: Progressing   Problem: Skin Integrity: Goal: Risk for impaired skin integrity will decrease Outcome: Progressing   

## 2018-08-12 NOTE — Evaluation (Signed)
Physical Therapy Evaluation Patient Details Name: Adrian Chaney MRN: [id number] DOB: 1961-07-02 Today's Date: 08/12/2018   History of Present Illness  57yo male who fell and was found to have closed right ankle fracture. He received an ORIF and possible syndesmosis fixation on 08/11/18. PMH HTN   Clinical Impression   Patient received in bed, very pleasant and willing to work with therapy but anxious regarding mobility and return home. Able to complete bed mobility with min guard to support surgical LE, transfers with RW and min guard, and gait approximately 15f in room with S and RW while maintaining NWB R LE. Did not progress gait due to lunch and other [facility] services having arrived (Data processing manager. Able to navigate single step with RW and S and also able to maintain NWB status well during stair navigation. He was left up in his chair with all needs met, visitor present, and chaplain attending. He will continue to benefit from skilled PT services in the acute setting as well as skilled HHPT services following DC.     Follow Up Recommendations Home health PT    Equipment Recommendations  Rolling walker with 5" wheels;3in1 (PT)    Recommendations for Other Services       Precautions / Restrictions Precautions Precautions: Fall Restrictions Weight Bearing Restrictions: Yes RLE Weight Bearing: Non weight bearing      Mobility  Bed Mobility Overal bed mobility: Needs Assistance Bed Mobility: Supine to Sit     Supine to sit: Min guard     General bed mobility comments: min guard to support R LE   Transfers Overall transfer level: Needs assistance Equipment used: Rolling walker (2 wheeled) Transfers: Sit to/from Stand Sit to Stand: Min guard         General transfer comment: min guard for safety, VC for hand placement and seqeuncing   Ambulation/Gait Ambulation/Gait assistance: Supervision Gait Distance (Feet): 10 Feet Assistive device: Rolling walker (2 wheeled) Gait  Pattern/deviations: Step-to pattern;Trunk flexed Gait velocity: decreased    General Gait Details: swing to pattern on L LE, R LE NWB; able to complete gait safely with RW and min guard; did not progress gait as lunch and chaplain had arrived   Stairs Stairs: Yes Stairs assistance: Supervision Stair Management: No rails;With walker Number of Stairs: 1 General stair comments: backwards ascent, forward descent with RW and S   Wheelchair Mobility    Modified Rankin (Stroke Patients Only)       Balance Overall balance assessment: Mild deficits observed, not formally tested                                           Pertinent Vitals/Pain Pain Assessment: No/denies pain    Home Living Family/patient expects to be discharged to:: Private residence Living Arrangements: Alone Available Help at Discharge: Neighbor;Available PRN/intermittently Type of Home: House Home Access: Stairs to enter Entrance Stairs-Rails: None Entrance Stairs-Number of Steps: two 1 step thresholds  Home Layout: One level Home Equipment: Crutches      Prior Function Level of Independence: Independent               Hand Dominance        Extremity/Trunk Assessment   Upper Extremity Assessment Upper Extremity Assessment: Generalized weakness    Lower Extremity Assessment Lower Extremity Assessment: Generalized weakness    Cervical / Trunk Assessment Cervical / Trunk Assessment: Normal  Communication   Communication: No difficulties  Cognition Arousal/Alertness: Awake/alert Behavior During Therapy: WFL for tasks assessed/performed Overall Cognitive Status: Within Functional Limits for tasks assessed                                        General Comments      Exercises     Assessment/Plan    PT Assessment Patient needs continued PT services  PT Problem List Decreased strength;Decreased range of motion;Decreased knowledge of use of DME;Decreased  activity tolerance;Decreased safety awareness;Decreased balance;Decreased mobility;Decreased coordination       PT Treatment Interventions DME instruction;Balance training;Gait training;Neuromuscular re-education;Stair training;Functional mobility training;Patient/family education;Therapeutic activities;Therapeutic exercise;Manual techniques    PT Goals (Current goals can be found in the Care Plan section)  Acute Rehab PT Goals Patient Stated Goal: feel better PT Goal Formulation: With patient Time For Goal Achievement: 08/26/18 Potential to Achieve Goals: Good    Frequency Min 4X/week   Barriers to discharge        Co-evaluation               AM-PAC PT "6 Clicks" Mobility  Outcome Measure Help needed turning from your back to your side while in a flat bed without using bedrails?: None Help needed moving from lying on your back to sitting on the side of a flat bed without using bedrails?: A Little Help needed moving to and from a bed to a chair (including a wheelchair)?: A Little Help needed standing up from a chair using your arms (e.g., wheelchair or bedside chair)?: A Little Help needed to walk in hospital room?: A Little Help needed climbing 3-5 steps with a railing? : A Little 6 Click Score: 19    End of Session Equipment Utilized During Treatment: Gait belt Activity Tolerance: Patient tolerated treatment well Patient left: in chair;with call bell/phone within reach;with family/visitor present   PT Visit Diagnosis: Muscle weakness (generalized) (M62.81);Difficulty in walking, not elsewhere classified (R26.2);History of falling (Z91.81)    Time: 3736-6815 PT Time Calculation (min) (ACUTE ONLY): 32 min   Charges:   PT Evaluation $PT Eval Low Complexity: 1 Low PT Treatments $Gait Training: 8-22 mins        Deniece Ree PT, DPT, CBIS  Supplemental Physical Therapist Force    Pager [phone] Acute Rehab Office [phone]

## 2018-08-12 NOTE — Progress Notes (Signed)
Chaplain responded to spiritual consult.  Pt possibly interested in Advanced Directive.  Chaplain explained AD to pt and his brother.  Will be available should the patient be interested in completing it. Tamsen Snider Pager [phone]

## 2018-08-12 NOTE — Progress Notes (Signed)
Nutrition Brief Note  Patient identified on the Malnutrition Screening Tool (MST) Report. Patient reports a little weight loss recently due to taking a "fluid pill." He has been eating well. No nutrition concerns at this time.   Wt Readings from Last 15 Encounters:  08/11/18 120.7 kg  08/07/18 120.7 kg    Body mass index is 34.16 kg/m. Patient meets criteria for obesity based on current BMI.   Current diet order is regular, patient is consuming approximately 100% of meals at this time. Labs and medications reviewed.   No nutrition interventions warranted at this time. If nutrition issues arise, please consult RD.   Molli Barrows, RD, LDN, [place] Pager [phone] After Hours Pager [phone]

## 2018-08-12 NOTE — Progress Notes (Signed)
   Subjective:  Patient reports pain as mild.  Block still in place currently.  Denies SOB, HA n/v or CP  Objective:   VITALS:   Vitals:   08/11/18 1834 08/11/18 1952 08/11/18 2300 08/12/18 0510  BP:  (!) 166/91 (!) 161/82 (!) 145/80  Pulse:  88  86  Resp:  18  20  Temp:  98.5 F (36.9 C)  98.6 F (37 C)  TempSrc:  Oral  Oral  SpO2:  95%  100%  Weight: 120.7 kg     Height: 6\' 2"  (1.88 m)       Intact pulses distally Dorsiflexion/Plantar flexion intact No cellulitis present Compartment soft Sensation absent in toes due to block.  No pain with passive stretch  Lab Results  Component Value Date   WBC 11.6 (H) 08/11/2018   HGB 14.3 08/11/2018   HCT 42.1 08/11/2018   MCV 91.7 08/11/2018   PLT 200 08/11/2018   BMET    Component Value Date/Time   NA 137 08/07/2018 1134   K 4.1 08/07/2018 1134   CL 106 08/07/2018 1134   CO2 22 08/07/2018 1134   GLUCOSE 112 (H) 08/07/2018 1134   BUN 17 08/07/2018 1134   CREATININE 0.87 08/11/2018 1906   CALCIUM 9.7 08/07/2018 1134   GFRNONAA >60 08/11/2018 1906   GFRAA >60 08/11/2018 1906     Assessment/Plan: 1 Day Post-Op   Active Problems:   Ankle fracture, right   Closed right ankle fracture   Up with therapy NWB LLE Will monitor pain control once block is off to ensure able to dc home Monitor BP.  Currently on home meds with PRN for SBP > 200 or DBP > 110 - strict elevation to RLE - will dc home when able   Nicholes Stairs 08/12/2018, 7:26 AM   Geralynn Rile, MD [phone]

## 2018-08-13 DIAGNOSIS — Y929 Unspecified place or not applicable: Secondary | ICD-10-CM | POA: Diagnosis not present

## 2018-08-13 DIAGNOSIS — Z87891 Personal history of nicotine dependence: Secondary | ICD-10-CM | POA: Diagnosis not present

## 2018-08-13 DIAGNOSIS — Z79899 Other long term (current) drug therapy: Secondary | ICD-10-CM | POA: Diagnosis not present

## 2018-08-13 DIAGNOSIS — K219 Gastro-esophageal reflux disease without esophagitis: Secondary | ICD-10-CM | POA: Diagnosis not present

## 2018-08-13 DIAGNOSIS — S82841A Displaced bimalleolar fracture of right lower leg, initial encounter for closed fracture: Secondary | ICD-10-CM | POA: Diagnosis not present

## 2018-08-13 DIAGNOSIS — S93431A Sprain of tibiofibular ligament of right ankle, initial encounter: Secondary | ICD-10-CM | POA: Diagnosis not present

## 2018-08-13 DIAGNOSIS — W19XXXA Unspecified fall, initial encounter: Secondary | ICD-10-CM | POA: Diagnosis not present

## 2018-08-13 DIAGNOSIS — Z23 Encounter for immunization: Secondary | ICD-10-CM | POA: Diagnosis not present

## 2018-08-13 DIAGNOSIS — I10 Essential (primary) hypertension: Secondary | ICD-10-CM | POA: Diagnosis not present

## 2018-08-13 MED ORDER — GABAPENTIN 600 MG PO TABS: 300.0000 mg | ORAL_TABLET | Freq: Three times a day (TID) | ORAL | 1 refills | Status: DC

## 2018-08-13 MED ORDER — ONDANSETRON HCL 4 MG PO TABS: 4.0000 mg | ORAL_TABLET | Freq: Four times a day (QID) | ORAL | 0 refills | Status: DC | PRN

## 2018-08-13 MED ORDER — OXYCODONE HCL 5 MG PO TABS: 5.0000 mg | ORAL_TABLET | ORAL | 0 refills | Status: DC | PRN

## 2018-08-13 NOTE — Progress Notes (Signed)
Patient discharged home on home health. Discharge instruction provided to patient, all questions answered and concern addressed. Patient verbalized understanding.

## 2018-08-13 NOTE — Progress Notes (Signed)
   Subjective:  Patient reports pain as mild.  Block has resolved and he has had good pain control with oral medications throughout the day today denies SOB, HA n/v or CP  Objective:   VITALS:   Vitals:   08/12/18 1421 08/12/18 2039 08/12/18 2209 08/13/18 0628  BP: (!) 165/85 (!) 165/95 (!) 159/88 (!) 175/95  Pulse: 83  66 66  Resp: 20  18   Temp: 97.8 F (36.6 C)  98.1 F (36.7 C) 98.1 F (36.7 C)  TempSrc: Oral  Oral Oral  SpO2: 96%  98% 97%  Weight:      Height:        Intact pulses distally Dorsiflexion/Plantar flexion intact No cellulitis present Compartment soft Sensation and motor intact throughout with no deficits.  No pain with passive stretch  Lab Results  Component Value Date   WBC 11.6 (H) 08/11/2018   HGB 14.3 08/11/2018   HCT 42.1 08/11/2018   MCV 91.7 08/11/2018   PLT 200 08/11/2018   BMET    Component Value Date/Time   NA 137 08/07/2018 1134   K 4.1 08/07/2018 1134   CL 106 08/07/2018 1134   CO2 22 08/07/2018 1134   GLUCOSE 112 (H) 08/07/2018 1134   BUN 17 08/07/2018 1134   CREATININE 0.87 08/11/2018 1906   CALCIUM 9.7 08/07/2018 1134   GFRNONAA >60 08/11/2018 1906   GFRAA >60 08/11/2018 1906     Assessment/Plan: 2 Days Post-Op   Active Problems:   Ankle fracture, right   Closed right ankle fracture   Up with therapy NWB LLE -PT is recommending home health PT which we will order. -Discharge home today. -Maintain splint and nonweightbearing strictly.  We will follow-up with me in 2 weeks.   Nicholes Stairs 08/13/2018, 1:31 PM   Geralynn Rile, MD [phone]

## 2018-08-13 NOTE — Progress Notes (Signed)
Physical Therapy Treatment Patient Details Name: Adrian Chaney MRN: [id number] DOB: 1960-10-16 Today's Date: 08/13/2018    History of Present Illness 57yo male who fell and was found to have closed right ankle fracture. He received an ORIF and possible syndesmosis fixation on 08/11/18. PMH HTN     PT Comments    Patient seen for mobility progression. Patient received IV pain medicine prior to mobilizing so increased time needed sitting EOB due to nausea and lightheadedness that passed prior to standing. Pt is able to ambulate 50 ft with RW and maintains NWB status with supervision. Min guard needed for safe functional transfers. Pt educated on uses of 3 in 1. Continue to progress as tolerated.    Follow Up Recommendations  Home health PT     Equipment Recommendations  Rolling walker with 5" wheels;3in1 (PT)    Recommendations for Other Services       Precautions / Restrictions Precautions Precautions: Fall Restrictions Weight Bearing Restrictions: Yes RLE Weight Bearing: Non weight bearing    Mobility  Bed Mobility Overal bed mobility: Modified Independent Bed Mobility: Supine to Sit           General bed mobility comments: increased time and effort  Transfers Overall transfer level: Needs assistance Equipment used: Rolling walker (2 wheeled) Transfers: Sit to/from Stand Sit to Stand: Min guard         General transfer comment: cues for safe hand placement; min guard for safety  Ambulation/Gait Ambulation/Gait assistance: Supervision Gait Distance (Feet): 50 Feet Assistive device: Rolling walker (2 wheeled) Gait Pattern/deviations: Step-to pattern;Trunk flexed Gait velocity: decreased    General Gait Details: L shoe donned; slow, steady gait and demonstrates safe use of AD   Stairs             Wheelchair Mobility    Modified Rankin (Stroke Patients Only)       Balance Overall balance assessment: Mild deficits observed, not formally tested                                           Cognition Arousal/Alertness: Awake/alert Behavior During Therapy: WFL for tasks assessed/performed Overall Cognitive Status: Within Functional Limits for tasks assessed                                        Exercises      General Comments General comments (skin integrity, edema, etc.): pt given IV dilaudid prior to standing and became nauseous which passed after ~5 minutes      Pertinent Vitals/Pain Pain Assessment: 0-10 Pain Score: 8  Pain Location: R ankle in dependent position; pt reports 5 when elevated Pain Descriptors / Indicators: Aching;Throbbing Pain Intervention(s): Limited activity within patient's tolerance;Monitored during session;Repositioned;Patient requesting pain meds-RN notified;RN gave pain meds during session    Home Living                      Prior Function            PT Goals (current goals can now be found in the care plan section) Acute Rehab PT Goals Patient Stated Goal: feel better Progress towards PT goals: Progressing toward goals    Frequency    Min 4X/week      PT Plan Current plan remains appropriate  Co-evaluation              AM-PAC PT "6 Clicks" Mobility   Outcome Measure  Help needed turning from your back to your side while in a flat bed without using bedrails?: None Help needed moving from lying on your back to sitting on the side of a flat bed without using bedrails?: A Little Help needed moving to and from a bed to a chair (including a wheelchair)?: A Little Help needed standing up from a chair using your arms (e.g., wheelchair or bedside chair)?: A Little Help needed to walk in hospital room?: A Little Help needed climbing 3-5 steps with a railing? : A Little 6 Click Score: 19    End of Session Equipment Utilized During Treatment: Gait belt Activity Tolerance: Patient tolerated treatment well Patient left: in chair;with call  bell/phone within reach Nurse Communication: Mobility status PT Visit Diagnosis: Muscle weakness (generalized) (M62.81);Difficulty in walking, not elsewhere classified (R26.2);History of falling (Z91.81)     Time: 9842-1031 PT Time Calculation (min) (ACUTE ONLY): 25 min  Charges:  $Gait Training: 8-22 mins $Therapeutic Activity: 8-22 mins                     Earney Navy, PTA Acute Rehabilitation Services Pager: [phone] Office: [phone]     Darliss Cheney 08/13/2018, 11:12 AM

## 2018-08-13 NOTE — Care Management Note (Signed)
Case Management Note  Patient Details  Name: Adrian Chaney MRN: [id number] Date of Birth: 1961-06-03  Subjective/Objective:  57 yr old gentleman s/p ORIF of right ankle.                  Action/Plan: Case manager spoke with patient concerning discharge plan and DME. Choice for Home Health agency was offered, referral was called to Ne[facility] Laming, [place] [facility]iaison. Patient says he lives alone but his brother lives near by as do other relatives.    Expected Discharge Date:  08/13/18               Expected Discharge Plan:  [place]  In-House Referral:  NA  Discharge planning Services  CM Consult  Post Acute Care Choice:  Durable Medical Equipment, Home Health Choice offered to:  Patient  DME Arranged:  3-N-1, Walker rolling DME Agency:  [place]:  PT [facility] Agency:  [place]  Status of Service:  Completed, signed off  If discussed at [place] of Stay Meetings, dates discussed:    Additional Comments:  Ninfa Meeker, RN 08/13/2018, 2:33 PM

## 2018-08-13 NOTE — Plan of Care (Signed)
  Problem: Education: Goal: Knowledge of General Education information will improve Description Including pain rating scale, medication(s)/side effects and non-pharmacologic comfort measures Outcome: Adequate for Discharge   Problem: Health Behavior/Discharge Planning: Goal: Ability to manage health-related needs will improve Outcome: Adequate for Discharge   Problem: Clinical Measurements: Goal: Ability to maintain clinical measurements within normal limits will improve Outcome: Adequate for Discharge Goal: Will remain free from infection Outcome: Adequate for Discharge Goal: Diagnostic test results will improve Outcome: Adequate for Discharge Goal: Respiratory complications will improve Outcome: Adequate for Discharge Goal: Cardiovascular complication will be avoided Outcome: Adequate for Discharge   Problem: Activity: Goal: Risk for activity intolerance will decrease Outcome: Adequate for Discharge   Problem: Nutrition: Goal: Adequate nutrition will be maintained Outcome: Adequate for Discharge   Problem: Coping: Goal: Level of anxiety will decrease Outcome: Adequate for Discharge   Problem: Elimination: Goal: Will not experience complications related to bowel motility Outcome: Adequate for Discharge Goal: Will not experience complications related to urinary retention Outcome: Adequate for Discharge   Problem: Pain Managment: Goal: General experience of comfort will improve Outcome: Adequate for Discharge   Problem: Safety: Goal: Ability to remain free from injury will improve Outcome: Adequate for Discharge   Problem: Skin Integrity: Goal: Risk for impaired skin integrity will decrease Outcome: Adequate for Discharge   Problem: Spiritual Needs Goal: Ability to function at adequate level Outcome: Adequate for Discharge

## 2018-08-13 NOTE — Plan of Care (Signed)
  Problem: Education: Goal: Knowledge of General Education information will improve Description Including pain rating scale, medication(s)/side effects and non-pharmacologic comfort measures Outcome: Progressing   Problem: Health Behavior/Discharge Planning: Goal: Ability to manage health-related needs will improve Outcome: Progressing   Problem: Clinical Measurements: Goal: Ability to maintain clinical measurements within normal limits will improve Outcome: Progressing Goal: Diagnostic test results will improve Outcome: Progressing   Problem: Activity: Goal: Risk for activity intolerance will decrease Outcome: Progressing   Problem: Elimination: Goal: Will not experience complications related to urinary retention Outcome: Progressing

## 2018-08-14 DIAGNOSIS — I10 Essential (primary) hypertension: Secondary | ICD-10-CM | POA: Diagnosis not present

## 2018-08-14 DIAGNOSIS — Z87891 Personal history of nicotine dependence: Secondary | ICD-10-CM | POA: Diagnosis not present

## 2018-08-14 DIAGNOSIS — S82841D Displaced bimalleolar fracture of right lower leg, subsequent encounter for closed fracture with routine healing: Secondary | ICD-10-CM | POA: Diagnosis not present

## 2018-08-14 DIAGNOSIS — Z9181 History of falling: Secondary | ICD-10-CM | POA: Diagnosis not present

## 2018-08-17 DIAGNOSIS — I10 Essential (primary) hypertension: Secondary | ICD-10-CM | POA: Diagnosis not present

## 2018-08-17 DIAGNOSIS — Z87891 Personal history of nicotine dependence: Secondary | ICD-10-CM | POA: Diagnosis not present

## 2018-08-17 DIAGNOSIS — S82841D Displaced bimalleolar fracture of right lower leg, subsequent encounter for closed fracture with routine healing: Secondary | ICD-10-CM | POA: Diagnosis not present

## 2018-08-17 DIAGNOSIS — Z9181 History of falling: Secondary | ICD-10-CM | POA: Diagnosis not present

## 2018-08-17 NOTE — Discharge Summary (Signed)
Patient ID: Adrian Chaney MRN: [id number] DOB/AGE: 56-Sep-1962 56 y.o.  Admit date: 08/11/2018 Discharge date: 08/13/2018  Primary Diagnosis: Right ankle fracture  Admission Diagnoses:  Past Medical History:  Diagnosis Date  . Hypertension    Discharge Diagnoses:   Active Problems:   Ankle fracture, right   Closed right ankle fracture  Estimated body mass index is 34.16 kg/m as calculated from the following:   Height as of this encounter: 6\' 2"  (1.88 m).   Weight as of this encounter: 120.7 kg.  Procedure:  Procedure(s) (LRB): Open reduction internal fixation right ankle with possible syndesmosis fixation (Right)   Consults: None  HPI: Mr. Adrian Chaney presented for operative fixation of his right ankle fracture. Laboratory Data: Admission on 08/11/2018, Discharged on 08/13/2018  Component Date Value Ref Range Status  . HIV Screen 4th Generation wRfx 08/11/2018 Non Reactive  Non Reactive Final   Comment: (NOTE) Performed At: [facility] [address] [place], [place] [id number] Rush Farmer MD IE:[id number]   . WBC 08/11/2018 11.6* 4.0 - 10.5 K/uL Final  . RBC 08/11/2018 4.59  4.22 - 5.81 MIL/uL Final  . Hemoglobin 08/11/2018 14.3  13.0 - 17.0 g/dL Final  . HCT 08/11/2018 42.1  39.0 - 52.0 % Final  . MCV 08/11/2018 91.7  80.0 - 100.0 fL Final  . MCH 08/11/2018 31.2  26.0 - 34.0 pg Final  . MCHC 08/11/2018 34.0  30.0 - 36.0 g/dL Final  . RDW 08/11/2018 11.7  11.5 - 15.5 % Final  . Platelets 08/11/2018 200  150 - 400 K/uL Final  . nRBC 08/11/2018 0.0  0.0 - 0.2 % Final   Performed at [facility], [place] [address], [place], [place] [address]  . Creatinine, Ser 08/11/2018 0.87  0.61 - 1.24 mg/dL Final  . GFR calc non Af Amer 08/11/2018 >60  >60 mL/min Final  . GFR calc Af Amer 08/11/2018 >60  >60 mL/min Final   Performed at [facility], [place] [address], [place], [place] [address]  Hospital Outpatient Visit on 08/07/2018  Component Date Value Ref Range  Status  . MRSA, PCR 08/07/2018 NEGATIVE  NEGATIVE Final  . Staphylococcus aureus 08/07/2018 NEGATIVE  NEGATIVE Final   Comment: (NOTE) The Xpert SA Assay (FDA approved for NASAL specimens in patients 28 years of age and older), is one component of a comprehensive surveillance program. It is not intended to diagnose infection nor to guide or monitor treatment. Performed at [facility], [place] [address] [place], [place] [address]   . Sodium 08/07/2018 137  135 - 145 mmol/L Final  . Potassium 08/07/2018 4.1  3.5 - 5.1 mmol/L Final  . Chloride 08/07/2018 106  98 - 111 mmol/L Final  . CO2 08/07/2018 22  22 - 32 mmol/L Final  . Glucose, Bld 08/07/2018 112* 70 - 99 mg/dL Final  . BUN 08/07/2018 17  6 - 20 mg/dL Final  . Creatinine, Ser 08/07/2018 0.98  0.61 - 1.24 mg/dL Final  . Calcium 08/07/2018 9.7  8.9 - 10.3 mg/dL Final  . GFR calc non Af Amer 08/07/2018 >60  >60 mL/min Final  . GFR calc Af Amer 08/07/2018 >60  >60 mL/min Final  . Anion gap 08/07/2018 9  5 - 15 Final   Performed at [facility], [place] [address] [place],  [address]  . WBC 08/07/2018 9.0  4.0 - 10.5 K/uL Final  . RBC 08/07/2018 5.02  4.22 - 5.81 MIL/uL Final  .  Hemoglobin 08/07/2018 15.5  13.0 - 17.0 g/dL Final  . HCT 08/07/2018 47.0  39.0 - 52.0 % Final  . MCV 08/07/2018 93.6  80.0 - 100.0 fL Final  . MCH 08/07/2018 30.9  26.0 - 34.0 pg Final  . MCHC 08/07/2018 33.0  30.0 - 36.0 g/dL Final  . RDW 08/07/2018 12.0  11.5 - 15.5 % Final  . Platelets 08/07/2018 198  150 - 400 K/uL Final  . nRBC 08/07/2018 0.0  0.0 - 0.2 % Final   Performed at [facility], [place] [address], [place], [place] [address]     X-Rays:Dg Ankle Complete Right  Result Date: 08/11/2018 CLINICAL DATA:  Distal fibular fracture ORIF. EXAM: DG C-ARM 61-120 MIN; RIGHT ANKLE - COMPLETE 3+ VIEW COMPARISON:  Right ankle x-rays dated August 03, 2018. FLUOROSCOPY TIME:  22 seconds. C-arm fluoroscopic images were obtained  intraoperatively and submitted for post operative interpretation. FINDINGS: Multiple intraoperative fluoroscopic images demonstrate interval lateral plate and screw fixation of the distal fibular fracture. Alignment is anatomic. No evidence of hardware failure or loosening. The ankle mortise is symmetric. The talar dome is intact. Expected soft tissue swelling. IMPRESSION: 1. Intraoperative fluoroscopic guidance for distal fibular fracture ORIF. Electronically Signed   By: Titus Dubin M.D.   On: 08/11/2018 16:16   Dg Ankle Right Port  Result Date: 08/11/2018 CLINICAL DATA:  History of ORIF of right ankle fracture EXAM: PORTABLE RIGHT ANKLE - 2 VIEW COMPARISON:  Right ankle C-arm films of 08/11/2017 and on ankle films of 08/03/2017 FINDINGS: Plate and screw fixation of the distal right fibular fracture has been performed. The tibiotalar articulation appears normal. A small metallic object overlies the distal right tibia medially of questionable significance. One loose screw is noted along the plate and screw fixation of the distal right fibula. IMPRESSION: . 1. Plate and screw fixation of the distal right fibular fracture with 1 screw loose adjacent to the fixation plate. 2. Tibiotalar articulation appears normal. Electronically Signed   By: Ivar Drape M.D.   On: 08/11/2018 17:00   Dg C-arm 1-60 Min  Result Date: 08/11/2018 CLINICAL DATA:  Distal fibular fracture ORIF. EXAM: DG C-ARM 61-120 MIN; RIGHT ANKLE - COMPLETE 3+ VIEW COMPARISON:  Right ankle x-rays dated August 03, 2018. FLUOROSCOPY TIME:  22 seconds. C-arm fluoroscopic images were obtained intraoperatively and submitted for post operative interpretation. FINDINGS: Multiple intraoperative fluoroscopic images demonstrate interval lateral plate and screw fixation of the distal fibular fracture. Alignment is anatomic. No evidence of hardware failure or loosening. The ankle mortise is symmetric. The talar dome is intact. Expected soft tissue  swelling. IMPRESSION: 1. Intraoperative fluoroscopic guidance for distal fibular fracture ORIF. Electronically Signed   By: Titus Dubin M.D.   On: 08/11/2018 16:16    EKG: Orders placed or performed during the hospital encounter of 08/07/18  . EKG 12 lead  . EKG 12 lead     Hospital Course: CROCKETT RALLO is a 57 y.o. who was admitted to Hospital. They were brought to the operating room on 08/11/2018 and underwent Procedure(s): Open reduction internal fixation right ankle with possible syndesmosis fixation.  Patient tolerated the procedure well and was later transferred to the recovery room and then to the orthopaedic floor for postoperative care.  They were given PO and IV analgesics for pain control following their surgery.  They were given 24 hours of postoperative antibiotics of  Anti-infectives (From admission, onward)   Start     Dose/Rate Route Frequency Ordered  Stop   08/11/18 1400  ceFAZolin (ANCEF) 3 g in dextrose 5 % 50 mL IVPB     3 g 100 mL/hr over 30 Minutes Intravenous To ShortStay Surgical 08/10/18 1247 08/11/18 1456     and started on DVT prophylaxis in the form of Aspirin.    Patient had a Good night on the evening of surgery.  They started to get up OOB with therapy on day one.   Continued to work with therapy into day two.    By day two, the patient had progressed with therapy and meeting their goals.  Incision was healing well.  Patient was seen in rounds and was ready to go home.   Diet: Regular diet Activity:NWB Follow-up:in 2 weeks Disposition - Home Discharged Condition: good   Discharge Instructions    Call MD / Call 911   Complete by:  As directed    If you experience chest pain or shortness of breath, CALL 911 and be transported to the hospital emergency room.  If you develope a fever above 101 F, pus (white drainage) or increased drainage or redness at the wound, or calf pain, call your surgeon's office.   Constipation Prevention   Complete by:  As  directed    Drink plenty of fluids.  Prune juice may be helpful.  You may use a stool softener, such as Colace (over the counter) 100 mg twice a day.  Use MiraLax (over the counter) for constipation as needed.   Diet - low sodium heart healthy   Complete by:  As directed    Face-to-face encounter (required for Medicare/Medicaid patients)   Complete by:  As directed    I Nicholes Stairs certify that this patient is under my care and that I, or a nurse practitioner or physician's assistant working with me, had a face-to-face encounter that meets the physician face-to-face encounter requirements with this patient on 08/13/2018. The encounter with the patient was in whole, or in part for the following medical condition(s) which is the primary reason for home health care (List medical condition):  Weakness Right ankle fracture   The encounter with the patient was in whole, or in part, for the following medical condition, which is the primary reason for home health care:  NWB and unable to leave home   I certify that, based on my findings, the following services are medically necessary home health services:  Physical therapy   Reason for Medically Necessary Home Health Services:  Therapy- Home Adaptation to Facilitate Safety   My clinical findings support the need for the above services:  Unable to leave home safely without assistance and/or assistive device   Further, I certify that my clinical findings support that this patient is homebound due to:  Unable to leave home safely without assistance   Home Health   Complete by:  As directed    To provide the following care/treatments:  PT   Increase activity slowly as tolerated   Complete by:  As directed      Allergies as of 08/13/2018   No Known Allergies     Medication List    STOP taking these medications   HYDROcodone-acetaminophen 5-325 MG tablet Commonly known as:  NORCO/VICODIN     TAKE these medications   Fish Oil 1000 MG  Caps Take 1,000 mg by mouth 2 (two) times daily.   FP GLUCOSAMINE PO Take 1 capsule by mouth every morning.   gabapentin 600 MG tablet Commonly known as:  NEURONTIN Take 0.5 tablets (300 mg total) by mouth 3 (three) times daily.   ibuprofen 200 MG tablet Commonly known as:  ADVIL,MOTRIN Take 600 mg by mouth every 6 (six) hours as needed (pain).   lisinopril 10 MG tablet Commonly known as:  PRINIVIL,ZESTRIL Take 10 mg by mouth 2 (two) times daily.   multivitamin with minerals Tabs tablet Take 2 tablets by mouth daily.   omeprazole 10 MG capsule Commonly known as:  PRILOSEC Take 10 mg by mouth daily.   ondansetron 4 MG tablet Commonly known as:  ZOFRAN Take 1 tablet (4 mg total) by mouth every 6 (six) hours as needed for nausea.   OVER THE COUNTER MEDICATION Take 2 capsules by mouth every morning. Slim Bayan (speeds metabolism up)   oxyCODONE 5 MG immediate release tablet Commonly known as:  Oxy IR/ROXICODONE Take 1-2 tablets (5-10 mg total) by mouth every 3 (three) hours as needed for breakthrough pain.   oxymetazoline 0.05 % nasal spray Commonly known as:  AFRIN Place 1 spray into both nostrils daily as needed for congestion.   tetrahydrozoline-zinc 0.05-0.25 % ophthalmic solution Commonly known as:  VISINE-AC Place 2 drops into both eyes 3 (three) times daily as needed (dry eyes).      Follow-up Information    Nicholes Stairs, MD In 2 weeks.   Specialty:  Orthopedic Surgery Why:  For suture removal Contact information: [address] [place] [address] [phone]        Home, [facility] Follow up.   Specialty:  [place] Why:  A representative from [facility] will contact you to arrange start date and time for your therapy. Contact information: [address] [place] [address] [phone]           Signed: Geralynn Rile, MD Orthopaedic Surgery 08/17/2018, 5:25 PM

## 2018-08-18 DIAGNOSIS — Z9181 History of falling: Secondary | ICD-10-CM | POA: Diagnosis not present

## 2018-08-18 DIAGNOSIS — Z87891 Personal history of nicotine dependence: Secondary | ICD-10-CM | POA: Diagnosis not present

## 2018-08-18 DIAGNOSIS — I10 Essential (primary) hypertension: Secondary | ICD-10-CM | POA: Diagnosis not present

## 2018-08-18 DIAGNOSIS — S82841D Displaced bimalleolar fracture of right lower leg, subsequent encounter for closed fracture with routine healing: Secondary | ICD-10-CM | POA: Diagnosis not present

## 2018-08-25 DIAGNOSIS — S8291XA Unspecified fracture of right lower leg, initial encounter for closed fracture: Secondary | ICD-10-CM | POA: Diagnosis not present

## 2018-08-25 DIAGNOSIS — Z4789 Encounter for other orthopedic aftercare: Secondary | ICD-10-CM | POA: Diagnosis not present

## 2018-09-01 DIAGNOSIS — S8264XD Nondisplaced fracture of lateral malleolus of right fibula, subsequent encounter for closed fracture with routine healing: Secondary | ICD-10-CM | POA: Diagnosis not present

## 2018-09-08 DIAGNOSIS — M25671 Stiffness of right ankle, not elsewhere classified: Secondary | ICD-10-CM | POA: Insufficient documentation

## 2018-09-10 DIAGNOSIS — M25671 Stiffness of right ankle, not elsewhere classified: Secondary | ICD-10-CM | POA: Diagnosis not present

## 2018-09-14 DIAGNOSIS — M25671 Stiffness of right ankle, not elsewhere classified: Secondary | ICD-10-CM | POA: Diagnosis not present

## 2018-09-16 DIAGNOSIS — M25671 Stiffness of right ankle, not elsewhere classified: Secondary | ICD-10-CM | POA: Diagnosis not present

## 2018-09-18 DIAGNOSIS — M25671 Stiffness of right ankle, not elsewhere classified: Secondary | ICD-10-CM | POA: Diagnosis not present

## 2018-09-21 DIAGNOSIS — M25671 Stiffness of right ankle, not elsewhere classified: Secondary | ICD-10-CM | POA: Diagnosis not present

## 2018-09-22 DIAGNOSIS — S82891D Other fracture of right lower leg, subsequent encounter for closed fracture with routine healing: Secondary | ICD-10-CM | POA: Diagnosis not present

## 2018-09-23 DIAGNOSIS — M25671 Stiffness of right ankle, not elsewhere classified: Secondary | ICD-10-CM | POA: Diagnosis not present

## 2018-09-25 DIAGNOSIS — M25671 Stiffness of right ankle, not elsewhere classified: Secondary | ICD-10-CM | POA: Diagnosis not present

## 2018-09-25 DIAGNOSIS — S8291XA Unspecified fracture of right lower leg, initial encounter for closed fracture: Secondary | ICD-10-CM | POA: Diagnosis not present

## 2018-09-28 DIAGNOSIS — M25671 Stiffness of right ankle, not elsewhere classified: Secondary | ICD-10-CM | POA: Diagnosis not present

## 2018-09-29 ENCOUNTER — Ambulatory Visit (INDEPENDENT_AMBULATORY_CARE_PROVIDER_SITE_OTHER): Payer: BLUE CROSS/BLUE SHIELD | Admitting: Family

## 2018-09-29 ENCOUNTER — Encounter: Payer: Self-pay | Admitting: Family

## 2018-09-29 VITALS — BP 167/100 | HR 79 | Temp 98.6°F | Ht 74.0 in | Wt 256.0 lb

## 2018-09-29 DIAGNOSIS — E669 Obesity, unspecified: Secondary | ICD-10-CM

## 2018-09-29 DIAGNOSIS — Z0000 Encounter for general adult medical examination without abnormal findings: Secondary | ICD-10-CM | POA: Diagnosis not present

## 2018-09-29 DIAGNOSIS — I10 Essential (primary) hypertension: Secondary | ICD-10-CM | POA: Diagnosis not present

## 2018-09-29 DIAGNOSIS — Z0001 Encounter for general adult medical examination with abnormal findings: Secondary | ICD-10-CM | POA: Diagnosis not present

## 2018-09-29 DIAGNOSIS — Z1211 Encounter for screening for malignant neoplasm of colon: Secondary | ICD-10-CM | POA: Diagnosis not present

## 2018-09-29 DIAGNOSIS — K219 Gastro-esophageal reflux disease without esophagitis: Secondary | ICD-10-CM

## 2018-09-29 MED ORDER — LISINOPRIL 40 MG PO TABS: 40.0000 mg | ORAL_TABLET | Freq: Every day | ORAL | 3 refills | Status: DC

## 2018-09-29 NOTE — Patient Instructions (Signed)

## 2018-09-29 NOTE — Progress Notes (Signed)
Subjective:    Patient ID: Adrian Chaney, male    DOB: 1961-06-20, 58 y.o.   MRN: [id number]  Chief Complaint  Patient presents with  . New Patient (Initial Visit)   PT presents to the office today to establish care and CPE. He fell in his yard in 07/2018 and broke his right ankle. He had surgery on 08/11/18. Hypertension  This is a new problem. The current episode started 1 to 4 weeks ago. The problem has been waxing and waning since onset. The problem is uncontrolled. Pertinent negatives include no headaches, peripheral edema or shortness of breath. Risk factors for coronary artery disease include obesity and male gender. Past treatments include ACE inhibitors. The current treatment provides mild improvement. There is no history of kidney disease or heart failure.  Gastroesophageal Reflux  He complains of heartburn. He reports no belching or no coughing. This is a chronic problem. The current episode started more than 1 year ago. The problem occurs occasionally. The symptoms are aggravated by certain foods. He has tried a PPI for the symptoms. The treatment provided moderate relief.      Review of Systems  Respiratory: Negative for cough and shortness of breath.   Gastrointestinal: Positive for heartburn.  Neurological: Negative for headaches.  All other systems reviewed and are negative.   Family History  Problem Relation Age of Onset  . Hypertension Mother   . Diabetes Father   . Cancer Father        colon  . Scoliosis Brother     Social History   Socioeconomic History  . Marital status: Single    Spouse name: Not on file  . Number of children: Not on file  . Years of education: Not on file  . Highest education level: Not on file  Occupational History  . Not on file  Social Needs  . Financial resource strain: Not on file  . Food insecurity:    Worry: Not on file    Inability: Not on file  . Transportation needs:    Medical: Not on file    Non-medical: Not on  file  Tobacco Use  . Smoking status: Former Research scientist (life sciences)  . Smokeless tobacco: Never Used  Substance and Sexual Activity  . Alcohol use: Not Currently    Alcohol/week: 1.0 - 2.0 standard drinks    Types: 1 - 2 Cans of beer per week  . Drug use: Yes    Frequency: 1.0 times per week    Types: Marijuana    Comment: 1 joint a week.   Marland Kitchen Sexual activity: Not on file  Lifestyle  . Physical activity:    Days per week: Not on file    Minutes per session: Not on file  . Stress: Not on file  Relationships  . Social connections:    Talks on phone: Not on file    Gets together: Not on file    Attends religious service: Not on file    Active member of club or organization: Not on file    Attends meetings of clubs or organizations: Not on file    Relationship status: Not on file  Other Topics Concern  . Not on file  Social History Narrative  . Not on file       Objective:   Physical Exam Vitals signs reviewed.  Constitutional:      General: He is not in acute distress.    Appearance: He is well-developed.  HENT:     Head:  Normocephalic.     Right Ear: Tympanic membrane normal.     Left Ear: Tympanic membrane normal.  Eyes:     General:        Right eye: No discharge.        Left eye: No discharge.     Pupils: Pupils are equal, round, and reactive to light.  Neck:     Musculoskeletal: Normal range of motion and neck supple.     Thyroid: No thyromegaly.  Cardiovascular:     Rate and Rhythm: Normal rate and regular rhythm.     Heart sounds: Normal heart sounds. No murmur.  Pulmonary:     Effort: Pulmonary effort is normal. No respiratory distress.     Breath sounds: Normal breath sounds. No wheezing.  Abdominal:     General: Bowel sounds are normal. There is no distension.     Palpations: Abdomen is soft.     Tenderness: There is no abdominal tenderness.  Musculoskeletal: Normal range of motion.        General: No tenderness.  Skin:    General: Skin is warm and dry.      Findings: No erythema or rash.  Neurological:     Mental Status: He is alert and oriented to person, place, and time.     Cranial Nerves: No cranial nerve deficit.     Deep Tendon Reflexes: Reflexes are normal and symmetric.  Psychiatric:        Behavior: Behavior normal.        Thought Content: Thought content normal.        Judgment: Judgment normal.       BP (!) 152/89   Pulse 79   Temp 98.6 F (37 C) (Oral)   Ht _0  (1.88 m)   Wt 256 lb (116.1 kg)   BMI 32.87 kg/m      Assessment & Plan:  KEITH FELTEN comes in today with chief complaint of New Patient (Initial Visit)   Diagnosis and orders addressed:  1. Annual physical exam - CMP14+EGFR - CBC with Differential/Platelet - Lipid panel - TSH - PSA, total and free - Hepatitis C antibody - Ambulatory referral to Gastroenterology  2. Essential hypertension Will increase Lisinopril to 40 mg from 20 mg  -Daily blood pressure log given with instructions on how to fill out and told to bring to next visit -Dash diet information given -Exercise encouraged - Stress Management  -Continue current meds -RTO in 2 weeks - CMP14+EGFR - CBC with Differential/Platelet - lisinopril (PRINIVIL,ZESTRIL) 40 MG tablet; Take 1 tablet (40 mg total) by mouth daily.  Dispense: 90 tablet; Refill: 3  3. Obesity (BMI 30-39.9) - CMP14+EGFR - CBC with Differential/Platelet  4. Colon cancer screening - CMP14+EGFR - CBC with Differential/Platelet - Ambulatory referral to Gastroenterology  5. Gastroesophageal reflux disease, esophagitis presence not specified   Labs pending Health Maintenance reviewed Diet and exercise encouraged  Follow up plan: 2 weeks to recheck HTN  Evelina Dun, FNP

## 2018-09-30 DIAGNOSIS — M25671 Stiffness of right ankle, not elsewhere classified: Secondary | ICD-10-CM | POA: Diagnosis not present

## 2018-09-30 LAB — CMP14+EGFR
ALT: 35 IU/L (ref 0–44)
AST: 21 IU/L (ref 0–40)
Albumin/Globulin Ratio: 2.3 — ABNORMAL HIGH (ref 1.2–2.2)
Albumin: 4.6 g/dL (ref 3.8–4.9)
Alkaline Phosphatase: 75 IU/L (ref 39–117)
BILIRUBIN TOTAL: 0.4 mg/dL (ref 0.0–1.2)
BUN / CREAT RATIO: 15 (ref 9–20)
BUN: 13 mg/dL (ref 6–24)
CHLORIDE: 102 mmol/L (ref 96–106)
CO2: 20 mmol/L (ref 20–29)
Calcium: 9.7 mg/dL (ref 8.7–10.2)
Creatinine, Ser: 0.84 mg/dL (ref 0.76–1.27)
GFR calc non Af Amer: 98 mL/min/{1.73_m2} (ref >59)
GFR, EST AFRICAN AMERICAN: 113 mL/min/{1.73_m2} (ref >59)
Globulin, Total: 2 g/dL (ref 1.5–4.5)
Glucose: 98 mg/dL (ref 65–99)
Potassium: 4.3 mmol/L (ref 3.5–5.2)
Sodium: 138 mmol/L (ref 134–144)
Total Protein: 6.6 g/dL (ref 6.0–8.5)

## 2018-09-30 LAB — LIPID PANEL
CHOLESTEROL TOTAL: 157 mg/dL (ref 100–199)
Chol/HDL Ratio: 4.9 ratio (ref 0.0–5.0)
HDL: 32 mg/dL — ABNORMAL LOW (ref >39)
LDL Calculated: 81 mg/dL (ref 0–99)
Triglycerides: 221 mg/dL — ABNORMAL HIGH (ref 0–149)
VLDL Cholesterol Cal: 44 mg/dL — ABNORMAL HIGH (ref 5–40)

## 2018-09-30 LAB — CBC WITH DIFFERENTIAL/PLATELET
Basophils Absolute: 0.1 10*3/uL (ref 0.0–0.2)
Basos: 1 %
EOS (ABSOLUTE): 0.1 10*3/uL (ref 0.0–0.4)
Eos: 2 %
Hematocrit: 44.5 % (ref 37.5–51.0)
Hemoglobin: 15.4 g/dL (ref 13.0–17.7)
Immature Grans (Abs): 0 10*3/uL (ref 0.0–0.1)
Immature Granulocytes: 0 %
LYMPHS ABS: 1.5 10*3/uL (ref 0.7–3.1)
Lymphs: 21 %
MCH: 31.2 pg (ref 26.6–33.0)
MCHC: 34.6 g/dL (ref 31.5–35.7)
MCV: 90 fL (ref 79–97)
Monocytes Absolute: 0.5 10*3/uL (ref 0.1–0.9)
Monocytes: 7 %
NEUTROS ABS: 4.7 10*3/uL (ref 1.4–7.0)
Neutrophils: 69 %
Platelets: 224 10*3/uL (ref 150–450)
RBC: 4.94 x10E6/uL (ref 4.14–5.80)
RDW: 11.8 % (ref 11.6–15.4)
WBC: 6.8 10*3/uL (ref 3.4–10.8)

## 2018-09-30 LAB — PSA, TOTAL AND FREE
PROSTATE SPECIFIC AG, SERUM: 2.6 ng/mL (ref 0.0–4.0)
PSA, Free Pct: 15.8 %
PSA, Free: 0.41 ng/mL

## 2018-09-30 LAB — TSH: TSH: 1.97 u[IU]/mL (ref 0.450–4.500)

## 2018-09-30 LAB — HEPATITIS C ANTIBODY: Hep C Virus Ab: <0.1 s/co ratio (ref 0.0–0.9)

## 2018-10-01 ENCOUNTER — Other Ambulatory Visit: Payer: Self-pay | Admitting: Family

## 2018-10-01 ENCOUNTER — Telehealth: Payer: Self-pay | Admitting: Family

## 2018-10-01 MED ORDER — ATORVASTATIN CALCIUM 20 MG PO TABS: 20.0000 mg | ORAL_TABLET | Freq: Every day | ORAL | 3 refills | Status: DC

## 2018-10-01 NOTE — Telephone Encounter (Signed)
Called and aware of labs

## 2018-10-02 DIAGNOSIS — M25671 Stiffness of right ankle, not elsewhere classified: Secondary | ICD-10-CM | POA: Diagnosis not present

## 2018-10-06 DIAGNOSIS — M25671 Stiffness of right ankle, not elsewhere classified: Secondary | ICD-10-CM | POA: Diagnosis not present

## 2018-10-09 DIAGNOSIS — M25671 Stiffness of right ankle, not elsewhere classified: Secondary | ICD-10-CM | POA: Diagnosis not present

## 2018-10-13 ENCOUNTER — Ambulatory Visit: Payer: BLUE CROSS/BLUE SHIELD | Admitting: Family

## 2018-10-14 DIAGNOSIS — M25671 Stiffness of right ankle, not elsewhere classified: Secondary | ICD-10-CM | POA: Diagnosis not present

## 2018-10-16 DIAGNOSIS — M25671 Stiffness of right ankle, not elsewhere classified: Secondary | ICD-10-CM | POA: Diagnosis not present

## 2018-10-20 DIAGNOSIS — M25671 Stiffness of right ankle, not elsewhere classified: Secondary | ICD-10-CM | POA: Diagnosis not present

## 2018-10-21 ENCOUNTER — Ambulatory Visit (INDEPENDENT_AMBULATORY_CARE_PROVIDER_SITE_OTHER): Payer: BLUE CROSS/BLUE SHIELD | Admitting: Family

## 2018-10-21 ENCOUNTER — Encounter: Payer: Self-pay | Admitting: Family

## 2018-10-21 VITALS — BP 125/78 | HR 89 | Temp 97.3°F | Ht 74.0 in | Wt 256.8 lb

## 2018-10-21 DIAGNOSIS — I10 Essential (primary) hypertension: Secondary | ICD-10-CM | POA: Diagnosis not present

## 2018-10-21 NOTE — Patient Instructions (Signed)

## 2018-10-21 NOTE — Progress Notes (Signed)
   Subjective:    Patient ID: Adrian Chaney, male    DOB: [birth year], 58 y.o.   MRN: [id number]  Chief Complaint  Patient presents with  . Hypertension   Pt presents to the office today to recheck HTN. PT's BP is at goal!! Hypertension  This is a chronic problem. The current episode started more than 1 year ago. The problem has been resolved since onset. The problem is controlled. Pertinent negatives include no anxiety, headaches, malaise/fatigue or shortness of breath. Past treatments include ACE inhibitors. The current treatment provides moderate improvement. There is no history of kidney disease, CAD/MI or heart failure.      Review of Systems  Constitutional: Negative for malaise/fatigue.  Respiratory: Negative for shortness of breath.   Neurological: Negative for headaches.  All other systems reviewed and are negative.      Objective:   Physical Exam Vitals signs reviewed.  Constitutional:      General: He is not in acute distress.    Appearance: He is well-developed.  HENT:     Head: Normocephalic.  Eyes:     General:        Right eye: No discharge.        Left eye: No discharge.     Pupils: Pupils are equal, round, and reactive to light.  Neck:     Musculoskeletal: Normal range of motion and neck supple.     Thyroid: No thyromegaly.  Cardiovascular:     Rate and Rhythm: Normal rate and regular rhythm.     Heart sounds: Normal heart sounds. No murmur.  Pulmonary:     Effort: Pulmonary effort is normal. No respiratory distress.     Breath sounds: Normal breath sounds. No wheezing.  Abdominal:     General: Bowel sounds are normal. There is no distension.     Palpations: Abdomen is soft.     Tenderness: There is no abdominal tenderness.  Skin:    General: Skin is warm and dry.     Findings: No erythema or rash.  Neurological:     Mental Status: He is alert and oriented to person, place, and time.     Cranial Nerves: No cranial nerve deficit.     Deep Tendon  Reflexes: Reflexes are normal and symmetric.  Psychiatric:        Behavior: Behavior normal.        Thought Content: Thought content normal.        Judgment: Judgment normal.       BP 125/78   Pulse 89   Temp (!) 97.3 F (36.3 C) (Oral)   Ht '6\' 2"'$  (1.88 m)   Wt 256 lb 12.8 oz (116.5 kg)   BMI 32.97 kg/m      Assessment & Plan:  Adrian Chaney comes in today with chief complaint of Hypertension   Diagnosis and orders addressed:  1. Essential hypertension -Dash diet information given -Exercise encouraged - Stress Management  -Continue current meds -RTO in 6 months - BMP8+EGFR   Evelina Dun, FNP

## 2018-10-22 LAB — BMP8+EGFR
BUN/Creatinine Ratio: 16 (ref 9–20)
BUN: 15 mg/dL (ref 6–24)
CO2: 20 mmol/L (ref 20–29)
Calcium: 9.6 mg/dL (ref 8.7–10.2)
Chloride: 104 mmol/L (ref 96–106)
Creatinine, Ser: 0.95 mg/dL (ref 0.76–1.27)
GFR calc Af Amer: 103 mL/min/{1.73_m2} (ref >59)
GFR calc non Af Amer: 89 mL/min/{1.73_m2} (ref >59)
Glucose: 133 mg/dL — ABNORMAL HIGH (ref 65–99)
Potassium: 4 mmol/L (ref 3.5–5.2)
Sodium: 141 mmol/L (ref 134–144)

## 2018-10-23 DIAGNOSIS — M25671 Stiffness of right ankle, not elsewhere classified: Secondary | ICD-10-CM | POA: Diagnosis not present

## 2018-10-26 DIAGNOSIS — S8291XA Unspecified fracture of right lower leg, initial encounter for closed fracture: Secondary | ICD-10-CM | POA: Diagnosis not present

## 2018-10-27 DIAGNOSIS — M25671 Stiffness of right ankle, not elsewhere classified: Secondary | ICD-10-CM | POA: Diagnosis not present

## 2018-10-27 DIAGNOSIS — Z4789 Encounter for other orthopedic aftercare: Secondary | ICD-10-CM | POA: Diagnosis not present

## 2018-10-30 DIAGNOSIS — M25671 Stiffness of right ankle, not elsewhere classified: Secondary | ICD-10-CM | POA: Diagnosis not present

## 2018-11-03 ENCOUNTER — Encounter: Payer: Self-pay | Admitting: Family

## 2018-11-03 DIAGNOSIS — M25671 Stiffness of right ankle, not elsewhere classified: Secondary | ICD-10-CM | POA: Diagnosis not present

## 2018-11-09 DIAGNOSIS — M25671 Stiffness of right ankle, not elsewhere classified: Secondary | ICD-10-CM | POA: Diagnosis not present

## 2018-11-13 DIAGNOSIS — M25671 Stiffness of right ankle, not elsewhere classified: Secondary | ICD-10-CM | POA: Diagnosis not present

## 2018-11-16 DIAGNOSIS — M25671 Stiffness of right ankle, not elsewhere classified: Secondary | ICD-10-CM | POA: Diagnosis not present

## 2018-11-19 DIAGNOSIS — M25671 Stiffness of right ankle, not elsewhere classified: Secondary | ICD-10-CM | POA: Diagnosis not present

## 2018-11-23 DIAGNOSIS — M25671 Stiffness of right ankle, not elsewhere classified: Secondary | ICD-10-CM | POA: Diagnosis not present

## 2018-11-24 DIAGNOSIS — S8291XA Unspecified fracture of right lower leg, initial encounter for closed fracture: Secondary | ICD-10-CM | POA: Diagnosis not present

## 2018-11-24 DIAGNOSIS — S8264XA Nondisplaced fracture of lateral malleolus of right fibula, initial encounter for closed fracture: Secondary | ICD-10-CM | POA: Diagnosis not present

## 2018-12-25 DIAGNOSIS — S8291XA Unspecified fracture of right lower leg, initial encounter for closed fracture: Secondary | ICD-10-CM | POA: Diagnosis not present

## 2019-01-18 ENCOUNTER — Telehealth: Payer: Self-pay | Admitting: Family

## 2019-01-24 DIAGNOSIS — S8291XA Unspecified fracture of right lower leg, initial encounter for closed fracture: Secondary | ICD-10-CM | POA: Diagnosis not present

## 2019-02-24 DIAGNOSIS — S8291XA Unspecified fracture of right lower leg, initial encounter for closed fracture: Secondary | ICD-10-CM | POA: Diagnosis not present

## 2019-03-26 DIAGNOSIS — S8291XA Unspecified fracture of right lower leg, initial encounter for closed fracture: Secondary | ICD-10-CM | POA: Diagnosis not present

## 2019-04-22 ENCOUNTER — Ambulatory Visit: Payer: BLUE CROSS/BLUE SHIELD | Admitting: Family

## 2019-04-22 ENCOUNTER — Other Ambulatory Visit: Payer: Self-pay

## 2019-04-26 DIAGNOSIS — S8291XA Unspecified fracture of right lower leg, initial encounter for closed fracture: Secondary | ICD-10-CM | POA: Diagnosis not present

## 2019-05-04 ENCOUNTER — Ambulatory Visit: Payer: BC Managed Care – PPO | Admitting: Family

## 2019-05-27 DIAGNOSIS — S8291XA Unspecified fracture of right lower leg, initial encounter for closed fracture: Secondary | ICD-10-CM | POA: Diagnosis not present

## 2019-06-07 ENCOUNTER — Ambulatory Visit (INDEPENDENT_AMBULATORY_CARE_PROVIDER_SITE_OTHER): Payer: BC Managed Care – PPO | Admitting: Family

## 2019-06-07 ENCOUNTER — Other Ambulatory Visit: Payer: Self-pay

## 2019-06-07 ENCOUNTER — Encounter: Payer: Self-pay | Admitting: Family

## 2019-06-07 VITALS — BP 159/91 | HR 74 | Temp 98.7°F | Ht 74.0 in | Wt 281.8 lb

## 2019-06-07 DIAGNOSIS — E669 Obesity, unspecified: Secondary | ICD-10-CM | POA: Diagnosis not present

## 2019-06-07 DIAGNOSIS — Z1211 Encounter for screening for malignant neoplasm of colon: Secondary | ICD-10-CM

## 2019-06-07 DIAGNOSIS — Z23 Encounter for immunization: Secondary | ICD-10-CM

## 2019-06-07 DIAGNOSIS — K219 Gastro-esophageal reflux disease without esophagitis: Secondary | ICD-10-CM | POA: Diagnosis not present

## 2019-06-07 DIAGNOSIS — I10 Essential (primary) hypertension: Secondary | ICD-10-CM

## 2019-06-07 NOTE — Patient Instructions (Signed)

## 2019-06-07 NOTE — Addendum Note (Signed)
Addended by: Shelbie Ammons on: 06/07/2019 03:05 PM   Modules accepted: Orders

## 2019-06-07 NOTE — Progress Notes (Signed)
Subjective:    Patient ID: Adrian Chaney, male    DOB: 25-Apr-1961, 58 y.o.   MRN: [id number]  Chief Complaint  Patient presents with  . Medical Management of Chronic Issues    six month recheck   Pt presents to the office today for chronic follow up.  Hypertension This is a chronic problem. The current episode started more than 1 year ago. The problem has been waxing and waning since onset. The problem is uncontrolled. Associated symptoms include malaise/fatigue. Pertinent negatives include no peripheral edema or shortness of breath. Risk factors for coronary artery disease include dyslipidemia, obesity, male gender and sedentary lifestyle. Past treatments include ACE inhibitors. The current treatment provides mild improvement. There is no history of kidney disease, CAD/MI or heart failure.  Gastroesophageal Reflux He complains of heartburn. He reports no belching or no coughing. This is a chronic problem. The current episode started more than 1 year ago. The problem occurs occasionally. The problem has been waxing and waning. Risk factors include obesity. He has tried a PPI for the symptoms. The treatment provided moderate relief.      Review of Systems  Constitutional: Positive for malaise/fatigue.  Respiratory: Negative for cough and shortness of breath.   Gastrointestinal: Positive for heartburn.  All other systems reviewed and are negative.      Objective:   Physical Exam Vitals signs reviewed.  Constitutional:      General: He is not in acute distress.    Appearance: He is well-developed.  HENT:     Head: Normocephalic.     Right Ear: Tympanic membrane normal.     Left Ear: Tympanic membrane normal.  Eyes:     General:        Right eye: No discharge.        Left eye: No discharge.     Pupils: Pupils are equal, round, and reactive to light.  Neck:     Musculoskeletal: Normal range of motion and neck supple.     Thyroid: No thyromegaly.  Cardiovascular:     Rate and  Rhythm: Normal rate and regular rhythm.     Heart sounds: Normal heart sounds. No murmur.  Pulmonary:     Effort: Pulmonary effort is normal. No respiratory distress.     Breath sounds: Normal breath sounds. No wheezing.  Abdominal:     General: Bowel sounds are normal. There is no distension.     Palpations: Abdomen is soft.     Tenderness: There is no abdominal tenderness.  Musculoskeletal: Normal range of motion.        General: No tenderness.  Skin:    General: Skin is warm and dry.     Findings: No erythema or rash.  Neurological:     Mental Status: He is alert and oriented to person, place, and time.     Cranial Nerves: No cranial nerve deficit.     Deep Tendon Reflexes: Reflexes are normal and symmetric.  Psychiatric:        Behavior: Behavior normal.        Thought Content: Thought content normal.        Judgment: Judgment normal.     BP (!) 159/91   Pulse 74   Temp 98.7 F (37.1 C) (Temporal)   Ht _0  (1.88 m)   Wt 281 lb 12.8 oz (127.8 kg)   SpO2 97%   BMI 36.18 kg/m      Assessment & Plan:  Adrian Chaney comes in  today with chief complaint of Medical Management of Chronic Issues (six month recheck)   Diagnosis and orders addressed:  1. Essential hypertension Pt will take log at home. If BP is constantly greater than >140/90 Will add medication  - CMP14+EGFR - CBC with Differential/Platelet  2. Gastroesophageal reflux disease, esophagitis presence not specified - CMP14+EGFR - CBC with Differential/Platelet  3. Obesity (BMI 30-39.9) - CMP14+EGFR - CBC with Differential/Platelet  4. Colon cancer screening - Ambulatory referral to Gastroenterology   Labs pending Health Maintenance reviewed Diet and exercise encouraged  Follow up plan: 6 months    Evelina Dun, FNP

## 2019-06-08 LAB — CBC WITH DIFFERENTIAL/PLATELET
Basophils Absolute: 0.1 10*3/uL (ref 0.0–0.2)
Basos: 1 %
EOS (ABSOLUTE): 0.2 10*3/uL (ref 0.0–0.4)
Eos: 3 %
Hematocrit: 41.9 % (ref 37.5–51.0)
Hemoglobin: 14.7 g/dL (ref 13.0–17.7)
Immature Grans (Abs): 0 10*3/uL (ref 0.0–0.1)
Immature Granulocytes: 0 %
Lymphocytes Absolute: 1.8 10*3/uL (ref 0.7–3.1)
Lymphs: 25 %
MCH: 31.5 pg (ref 26.6–33.0)
MCHC: 35.1 g/dL (ref 31.5–35.7)
MCV: 90 fL (ref 79–97)
Monocytes Absolute: 0.5 10*3/uL (ref 0.1–0.9)
Monocytes: 8 %
Neutrophils Absolute: 4.4 10*3/uL (ref 1.4–7.0)
Neutrophils: 63 %
Platelets: 192 10*3/uL (ref 150–450)
RBC: 4.67 x10E6/uL (ref 4.14–5.80)
RDW: 12 % (ref 11.6–15.4)
WBC: 7 10*3/uL (ref 3.4–10.8)

## 2019-06-08 LAB — CMP14+EGFR
ALT: 30 IU/L (ref 0–44)
AST: 26 IU/L (ref 0–40)
Albumin/Globulin Ratio: 2 (ref 1.2–2.2)
Albumin: 4.4 g/dL (ref 3.8–4.9)
Alkaline Phosphatase: 74 IU/L (ref 39–117)
BUN/Creatinine Ratio: 12 (ref 9–20)
BUN: 12 mg/dL (ref 6–24)
Bilirubin Total: 0.5 mg/dL (ref 0.0–1.2)
CO2: 22 mmol/L (ref 20–29)
Calcium: 9.1 mg/dL (ref 8.7–10.2)
Chloride: 103 mmol/L (ref 96–106)
Creatinine, Ser: 0.97 mg/dL (ref 0.76–1.27)
GFR calc Af Amer: 100 mL/min/{1.73_m2} (ref >59)
GFR calc non Af Amer: 86 mL/min/{1.73_m2} (ref >59)
Globulin, Total: 2.2 g/dL (ref 1.5–4.5)
Glucose: 115 mg/dL — ABNORMAL HIGH (ref 65–99)
Potassium: 4.1 mmol/L (ref 3.5–5.2)
Sodium: 140 mmol/L (ref 134–144)
Total Protein: 6.6 g/dL (ref 6.0–8.5)

## 2019-06-09 ENCOUNTER — Encounter: Payer: Self-pay | Admitting: Internal Medicine

## 2019-06-30 ENCOUNTER — Ambulatory Visit (AMBULATORY_SURGERY_CENTER): Payer: BC Managed Care – PPO | Admitting: *Deleted

## 2019-06-30 ENCOUNTER — Other Ambulatory Visit: Payer: Self-pay

## 2019-06-30 ENCOUNTER — Encounter: Payer: Self-pay | Admitting: Internal Medicine

## 2019-06-30 VITALS — Temp 96.9°F | Ht 74.0 in | Wt 270.0 lb

## 2019-06-30 DIAGNOSIS — Z1211 Encounter for screening for malignant neoplasm of colon: Secondary | ICD-10-CM

## 2019-06-30 DIAGNOSIS — Z1159 Encounter for screening for other viral diseases: Secondary | ICD-10-CM

## 2019-06-30 MED ORDER — NA SULFATE-K SULFATE-MG SULF 17.5-3.13-1.6 GM/177ML PO SOLN: 1.0000 | Freq: Once | ORAL | 0 refills | Status: AC

## 2019-06-30 NOTE — Progress Notes (Signed)
No egg or soy allergy known to patient  No issues with past sedation with any surgeries  or procedures, no intubation problems  No diet pills per patient No home 02 use per patient  No blood thinners per patient  Pt denies issues with constipation  No A fib or A flutter  EMMI video sent to pt's e mail   Due to the COVID-19 pandemic we are asking patients to follow these guidelines. Please only bring one care partner. Please be aware that your care partner may wait in the car in the parking lot or if they feel like they will be too hot to wait in the car, they may wait in the lobby on the 4th floor. All care partners are required to wear a mask the entire time (we do not have any that we can provide them), they need to practice social distancing, and we will do a Covid check for all patient's and care partners when you arrive. Also we will check their temperature and your temperature. If the care partner waits in their car they need to stay in the parking lot the entire time and we will call them on their cell phone when the patient is ready for discharge so they can bring the car to the front of the building. Also all patient's will need to wear a mask into building.  SUPREP COUPON PROVIDED.  COVID SCREENING 07/07/19, 10 AM

## 2019-07-07 ENCOUNTER — Other Ambulatory Visit: Payer: Self-pay | Admitting: Internal Medicine

## 2019-07-07 DIAGNOSIS — Z1159 Encounter for screening for other viral diseases: Secondary | ICD-10-CM | POA: Diagnosis not present

## 2019-07-08 LAB — SARS CORONAVIRUS 2 (TAT 6-24 HRS): SARS Coronavirus 2: NEGATIVE

## 2019-07-12 ENCOUNTER — Encounter: Payer: Self-pay | Admitting: Internal Medicine

## 2019-07-12 ENCOUNTER — Ambulatory Visit (AMBULATORY_SURGERY_CENTER): Payer: BC Managed Care – PPO | Admitting: Internal Medicine

## 2019-07-12 ENCOUNTER — Other Ambulatory Visit: Payer: Self-pay

## 2019-07-12 VITALS — BP 130/59 | HR 66 | Temp 97.5°F | Resp 21 | Ht 74.0 in | Wt 270.0 lb

## 2019-07-12 DIAGNOSIS — D122 Benign neoplasm of ascending colon: Secondary | ICD-10-CM

## 2019-07-12 DIAGNOSIS — Z1211 Encounter for screening for malignant neoplasm of colon: Secondary | ICD-10-CM

## 2019-07-12 DIAGNOSIS — Z800 Family history of malignant neoplasm of digestive organs: Secondary | ICD-10-CM

## 2019-07-12 DIAGNOSIS — D124 Benign neoplasm of descending colon: Secondary | ICD-10-CM | POA: Diagnosis not present

## 2019-07-12 DIAGNOSIS — D123 Benign neoplasm of transverse colon: Secondary | ICD-10-CM

## 2019-07-12 DIAGNOSIS — D128 Benign neoplasm of rectum: Secondary | ICD-10-CM | POA: Diagnosis not present

## 2019-07-12 MED ORDER — SODIUM CHLORIDE 0.9 % IV SOLN: 500.0000 mL | Freq: Once | INTRAVENOUS | Status: DC

## 2019-07-12 NOTE — Patient Instructions (Signed)
Handouts on polyps ,diverticulosis & hemorrhoids given to you today  Await pathology results on polyps removed  Repeat colonoscopy in 3 years    YOU HAD AN ENDOSCOPIC PROCEDURE TODAY AT [facility]:   Refer to the procedure report that was given to you for any specific questions about what was found during the examination.  If the procedure report does not answer your questions, please call your gastroenterologist to clarify.  If you requested that your care partner not be given the details of your procedure findings, then the procedure report has been included in a sealed envelope for you to review at your convenience later.  YOU SHOULD EXPECT: Some feelings of bloating in the abdomen. Passage of more gas than usual.  Walking can help get rid of the air that was put into your GI tract during the procedure and reduce the bloating. If you had a lower endoscopy (such as a colonoscopy or flexible sigmoidoscopy) you may notice spotting of blood in your stool or on the toilet paper. If you underwent a bowel prep for your procedure, you may not have a normal bowel movement for a few days.  Please Note:  You might notice some irritation and congestion in your nose or some drainage.  This is from the oxygen used during your procedure.  There is no need for concern and it should clear up in a day or so.  SYMPTOMS TO REPORT IMMEDIATELY:   Following lower endoscopy (colonoscopy or flexible sigmoidoscopy):  Excessive amounts of blood in the stool  Significant tenderness or worsening of abdominal pains  Swelling of the abdomen that is new, acute  Fever of 100F or higher    Black, tarry-looking stools  For urgent or emergent issues, a gastroenterologist can be reached at any hour by calling [phone].   DIET:  We do recommend a small meal at first, but then you may proceed to your regular diet.  Drink plenty of fluids but you should avoid alcoholic beverages for 24  hours.  ACTIVITY:  You should plan to take it easy for the rest of today and you should NOT DRIVE or use heavy machinery until tomorrow (because of the sedation medicines used during the test).    FOLLOW UP: Our staff will call the number listed on your records 48-72 hours following your procedure to check on you and address any questions or concerns that you may have regarding the information given to you following your procedure. If we do not reach you, we will leave a message.  We will attempt to reach you two times.  During this call, we will ask if you have developed any symptoms of COVID 19. If you develop any symptoms (ie: fever, flu-like symptoms, shortness of breath, cough etc.) before then, please call [phone].  If you test positive for Covid 19 in the 2 weeks post procedure, please call and report this information to [place].    If any biopsies were taken you will be contacted by phone or by letter within the next 1-3 weeks.  Please call us at [phone] if you have not heard about the biopsies in 3 weeks.    SIGNATURES/CONFIDENTIALITY: You and/or your care partner have signed paperwork which will be entered into your electronic medical record.  These signatures attest to the fact that that the information above on your After Visit Summary has been reviewed and is understood.  Full responsibility of the confidentiality of this discharge information lies  with you and/or your care-partner.

## 2019-07-12 NOTE — Progress Notes (Signed)
Temp check by:LC Vital check by:CW  The patient states no changes in medical or surgical history since pre-visit screening on 06/30/2019.

## 2019-07-12 NOTE — Progress Notes (Signed)
PT taken to PACU. Monitors in place. VSS. Report given to RN. 

## 2019-07-12 NOTE — Op Note (Signed)
[facility] Patient Name: Adrian Chaney Procedure Date: 07/12/2019 9:39 AM MRN: [id number] Endoscopist: Docia Chuck. Henrene Pastor , MD Age: 58 Referring MD:  Date of Birth: 11-12-1960 Gender: Male Account #: [id number] Procedure:                Colonoscopy with cold snare polypectomy x 7 Indications:              Screening for colorectal malignant neoplasm, high                            risk. Father with colon cancer in early 39s Medicines:                Monitored Anesthesia Care Procedure:                Pre-Anesthesia Assessment:                           - Prior to the procedure, a History and Physical                            was performed, and patient medications and                            allergies were reviewed. The patient's tolerance of                            previous anesthesia was also reviewed. The risks                            and benefits of the procedure and the sedation                            options and risks were discussed with the patient.                            All questions were answered, and informed consent                            was obtained. Prior Anticoagulants: The patient has                            taken no previous anticoagulant or antiplatelet                            agents. ASA Grade Assessment: II - A patient with                            mild systemic disease. After reviewing the risks                            and benefits, the patient was deemed in                            satisfactory condition to undergo the procedure.  After obtaining informed consent, the colonoscope                            was passed under direct vision. Throughout the                            procedure, the patient's blood pressure, pulse, and                            oxygen saturations were monitored continuously. The                            Colonoscope was introduced through the anus and           advanced to the the cecum, identified by                            appendiceal orifice and ileocecal valve. The                            ileocecal valve, appendiceal orifice, and rectum                            were photographed. The quality of the bowel                            preparation was excellent. The colonoscopy was                            performed without difficulty. The patient tolerated                            the procedure well. The bowel preparation used was                            SUPREP via split dose instruction. Scope In: 9:43:59 AM Scope Out: 10:01:57 AM Scope Withdrawal Time: 0 hours 14 minutes 45 seconds  Total Procedure Duration: 0 hours 17 minutes 58 seconds  Findings:                 Seven polyps were found in the rectum, descending                            colon, transverse colon and ascending colon. The                            polyps were 3 to 7 mm in size. These polyps were                            removed with a cold snare. Resection and retrieval                            were complete. The largest polyp was a rectal  polyp. There was minor but persistent oozing post                            polypectomy which was successfully treated with                            snare tip cauterization.                           Multiple diverticula were found in the left colon.                           Internal hemorrhoids were found during retroflexion.                           The exam was otherwise without abnormality on                            direct and retroflexion views. Complications:            No immediate complications. Estimated blood loss:                            None. Estimated Blood Loss:     Estimated blood loss: none. Impression:               - Seven 3 to 7 mm polyps in the rectum, in the                            descending colon, in the transverse colon and in                             the ascending colon, removed with a cold snare.                            Resected and retrieved.                           - Diverticulosis in the left colon.                           - Internal hemorrhoids.                           - The examination was otherwise normal on direct                            and retroflexion views. Recommendation:           - Repeat colonoscopy in 3 years for surveillance.                           - Patient has a contact number available for                            emergencies. The signs and symptoms of potential  delayed complications were discussed with the                            patient. Return to normal activities tomorrow.                            Written discharge instructions were provided to the                            patient.                           - Resume previous diet.                           - Continue present medications.                           - Await pathology results. Docia Chuck. Henrene Pastor, MD 07/12/2019 10:09:54 AM This report has been signed electronically.

## 2019-07-12 NOTE — Progress Notes (Signed)
Called to room to assist during endoscopic procedure.  Patient ID and intended procedure confirmed with present staff. Received instructions for my participation in the procedure from the performing physician.  

## 2019-07-14 ENCOUNTER — Encounter: Payer: Self-pay | Admitting: Internal Medicine

## 2019-07-14 ENCOUNTER — Telehealth: Payer: Self-pay

## 2019-07-14 NOTE — Telephone Encounter (Signed)
  Follow up Call-  Call back number 07/12/2019  Post procedure Call Back phone  # [phone]  Permission to leave phone message Yes  Some recent data might be hidden     Patient questions:  Do you have a fever, pain , or abdominal swelling? No. Pain Score  0 *  Have you tolerated food without any problems? Yes.    Have you been able to return to your normal activities? Yes.    Do you have any questions about your discharge instructions: Diet   No. Medications  No. Follow up visit  No.  Do you have questions or concerns about your Care? No.  Actions: * If pain score is 4 or above: No action needed, pain <4.  1. Have you developed a fever since your procedure? no  2.   Have you had an respiratory symptoms (SOB or cough) since your procedure? no  3.   Have you tested positive for COVID 19 since your procedure no  4.   Have you had any family members/close contacts diagnosed with the COVID 19 since your procedure?  no   If yes to any of these questions please route to Joylene John, RN and Alphonsa Gin, Therapist, sports.

## 2019-10-01 ENCOUNTER — Other Ambulatory Visit: Payer: Self-pay | Admitting: Family

## 2019-10-01 DIAGNOSIS — I10 Essential (primary) hypertension: Secondary | ICD-10-CM

## 2019-12-06 ENCOUNTER — Ambulatory Visit: Payer: BC Managed Care – PPO | Admitting: Family

## 2019-12-09 ENCOUNTER — Ambulatory Visit: Payer: BC Managed Care – PPO | Admitting: Family

## 2019-12-27 ENCOUNTER — Ambulatory Visit (INDEPENDENT_AMBULATORY_CARE_PROVIDER_SITE_OTHER): Payer: BC Managed Care – PPO | Admitting: Family

## 2019-12-27 ENCOUNTER — Encounter: Payer: Self-pay | Admitting: Family

## 2019-12-27 ENCOUNTER — Other Ambulatory Visit: Payer: Self-pay

## 2019-12-27 VITALS — BP 164/83 | HR 68 | Temp 97.4°F | Wt 275.0 lb

## 2019-12-27 DIAGNOSIS — Z0000 Encounter for general adult medical examination without abnormal findings: Secondary | ICD-10-CM

## 2019-12-27 DIAGNOSIS — K219 Gastro-esophageal reflux disease without esophagitis: Secondary | ICD-10-CM | POA: Diagnosis not present

## 2019-12-27 DIAGNOSIS — E669 Obesity, unspecified: Secondary | ICD-10-CM | POA: Diagnosis not present

## 2019-12-27 DIAGNOSIS — Z0001 Encounter for general adult medical examination with abnormal findings: Secondary | ICD-10-CM | POA: Diagnosis not present

## 2019-12-27 DIAGNOSIS — I10 Essential (primary) hypertension: Secondary | ICD-10-CM | POA: Diagnosis not present

## 2019-12-27 MED ORDER — HYDROCHLOROTHIAZIDE 25 MG PO TABS: 25.0000 mg | ORAL_TABLET | Freq: Every day | ORAL | 3 refills | Status: DC

## 2019-12-27 NOTE — Progress Notes (Signed)
Subjective:    Patient ID: Adrian Chaney, male    DOB: Aug 27, 1961, 59 y.o.   MRN: [id number]  Chief Complaint  Patient presents with  . Medical Management of Chronic Issues   Pt presents to the office today for CPE. Hypertension This is a chronic problem. The current episode started more than 1 year ago. The problem has been waxing and waning since onset. The problem is uncontrolled. Pertinent negatives include no headaches, malaise/fatigue, peripheral edema or shortness of breath. Risk factors for coronary artery disease include dyslipidemia, obesity, male gender and sedentary lifestyle. The current treatment provides moderate improvement. There is no history of kidney disease.  Gastroesophageal Reflux He complains of belching and heartburn. This is a chronic problem. The current episode started more than 1 year ago. The problem occurs occasionally. The problem has been waxing and waning. Risk factors include obesity. He has tried a PPI for the symptoms. The treatment provided significant relief.      Review of Systems  Constitutional: Negative for malaise/fatigue.  Respiratory: Negative for shortness of breath.   Gastrointestinal: Positive for heartburn.  Neurological: Negative for headaches.  All other systems reviewed and are negative.  Family History  Problem Relation Age of Onset  . Hypertension Mother   . Diabetes Father   . Cancer Father        colon  . Colon cancer Father   . Scoliosis Brother   . Esophageal cancer Neg Hx   . Rectal cancer Neg Hx   . Stomach cancer Neg Hx    Social History   Socioeconomic History  . Marital status: Single    Spouse name: Not on file  . Number of children: Not on file  . Years of education: Not on file  . Highest education level: Not on file  Occupational History  . Not on file  Tobacco Use  . Smoking status: Former Research scientist (life sciences)  . Smokeless tobacco: Never Used  Substance and Sexual Activity  . Alcohol use: Not Currently   Alcohol/week: 1.0 - 2.0 standard drinks    Types: 1 - 2 Cans of beer per week  . Drug use: Yes    Frequency: 1.0 times per week    Types: Marijuana    Comment: 1 joint a week.   Marland Kitchen Sexual activity: Not on file  Other Topics Concern  . Not on file  Social History Narrative  . Not on file   Social Determinants of Health   Financial Resource Strain:   . Difficulty of Paying Living Expenses:   Food Insecurity:   . Worried About Charity fundraiser in the Last Year:   . Arboriculturist in the Last Year:   Transportation Needs:   . Film/video editor (Medical):   Marland Kitchen Lack of Transportation (Non-Medical):   Physical Activity:   . Days of Exercise per Week:   . Minutes of Exercise per Session:   Stress:   . Feeling of Stress :   Social Connections:   . Frequency of Communication with Friends and Family:   . Frequency of Social Gatherings with Friends and Family:   . Attends Religious Services:   . Active Member of Clubs or Organizations:   . Attends Archivist Meetings:   Marland Kitchen Marital Status:        Objective:   Physical Exam Vitals reviewed.  Constitutional:      General: He is not in acute distress.    Appearance: He is well-developed.  HENT:     Head: Normocephalic.     Right Ear: Tympanic membrane normal.     Left Ear: Tympanic membrane normal.  Eyes:     General:        Right eye: No discharge.        Left eye: No discharge.     Pupils: Pupils are equal, round, and reactive to light.  Neck:     Thyroid: No thyromegaly.  Cardiovascular:     Rate and Rhythm: Normal rate and regular rhythm.     Heart sounds: Normal heart sounds. No murmur.  Pulmonary:     Effort: Pulmonary effort is normal. No respiratory distress.     Breath sounds: Normal breath sounds. No wheezing.  Abdominal:     General: Bowel sounds are normal. There is no distension.     Palpations: Abdomen is soft.     Tenderness: There is no abdominal tenderness.  Musculoskeletal:         General: No tenderness. Normal range of motion.     Cervical back: Normal range of motion and neck supple.  Skin:    General: Skin is warm and dry.     Findings: No erythema or rash.  Neurological:     Mental Status: He is alert and oriented to person, place, and time.     Cranial Nerves: No cranial nerve deficit.     Deep Tendon Reflexes: Reflexes are normal and symmetric.  Psychiatric:        Behavior: Behavior normal.        Thought Content: Thought content normal.        Judgment: Judgment normal.      BP (!) 164/83   Pulse 68   Temp (!) 97.4 F (36.3 C) (Temporal)   Wt 275 lb (124.7 kg)   SpO2 94%   BMI 35.31 kg/m      Assessment & Plan:  Adrian Chaney comes in today with chief complaint of Medical Management of Chronic Issues   Diagnosis and orders addressed:  1. Essential hypertension -Will start HCTZ 25 mg today -Dash diet information given -Exercise encouraged - Stress Management  -Continue current meds - CMP14+EGFR - CBC with Differential/Platelet - hydrochlorothiazide (HYDRODIURIL) 25 MG tablet; Take 1 tablet (25 mg total) by mouth daily.  Dispense: 90 tablet; Refill: 3  2. Gastroesophageal reflux disease, unspecified whether esophagitis present - CMP14+EGFR - CBC with Differential/Platelet  3. Obesity (BMI 30-39.9) - CMP14+EGFR - CBC with Differential/Platelet  4. Annual physical exam - CMP14+EGFR - CBC with Differential/Platelet - Lipid panel - PSA, total and free - TSH   Labs pending Health Maintenance reviewed Diet and exercise encouraged  Follow up plan: 2 weeks in HTN   Evelina Dun, FNP

## 2019-12-27 NOTE — Patient Instructions (Signed)

## 2019-12-28 LAB — CMP14+EGFR
ALT: 54 IU/L — ABNORMAL HIGH (ref 0–44)
AST: 34 IU/L (ref 0–40)
Albumin/Globulin Ratio: 2.1 (ref 1.2–2.2)
Albumin: 4.5 g/dL (ref 3.8–4.9)
Alkaline Phosphatase: 76 IU/L (ref 39–117)
BUN/Creatinine Ratio: 16 (ref 9–20)
BUN: 15 mg/dL (ref 6–24)
Bilirubin Total: 0.4 mg/dL (ref 0.0–1.2)
CO2: 22 mmol/L (ref 20–29)
Calcium: 9.4 mg/dL (ref 8.7–10.2)
Chloride: 104 mmol/L (ref 96–106)
Creatinine, Ser: 0.94 mg/dL (ref 0.76–1.27)
GFR calc Af Amer: 104 mL/min/{1.73_m2} (ref >59)
GFR calc non Af Amer: 90 mL/min/{1.73_m2} (ref >59)
Globulin, Total: 2.1 g/dL (ref 1.5–4.5)
Glucose: 93 mg/dL (ref 65–99)
Potassium: 4 mmol/L (ref 3.5–5.2)
Sodium: 140 mmol/L (ref 134–144)
Total Protein: 6.6 g/dL (ref 6.0–8.5)

## 2019-12-28 LAB — CBC WITH DIFFERENTIAL/PLATELET
Basophils Absolute: 0.1 10*3/uL (ref 0.0–0.2)
Basos: 1 %
EOS (ABSOLUTE): 0.2 10*3/uL (ref 0.0–0.4)
Eos: 2 %
Hematocrit: 42.5 % (ref 37.5–51.0)
Hemoglobin: 14.7 g/dL (ref 13.0–17.7)
Immature Grans (Abs): 0 10*3/uL (ref 0.0–0.1)
Immature Granulocytes: 0 %
Lymphocytes Absolute: 2.3 10*3/uL (ref 0.7–3.1)
Lymphs: 28 %
MCH: 31.3 pg (ref 26.6–33.0)
MCHC: 34.6 g/dL (ref 31.5–35.7)
MCV: 91 fL (ref 79–97)
Monocytes Absolute: 0.6 10*3/uL (ref 0.1–0.9)
Monocytes: 8 %
Neutrophils Absolute: 5 10*3/uL (ref 1.4–7.0)
Neutrophils: 61 %
Platelets: 188 10*3/uL (ref 150–450)
RBC: 4.69 x10E6/uL (ref 4.14–5.80)
RDW: 12 % (ref 11.6–15.4)
WBC: 8.1 10*3/uL (ref 3.4–10.8)

## 2019-12-28 LAB — LIPID PANEL
Chol/HDL Ratio: 3.3 ratio (ref 0.0–5.0)
Cholesterol, Total: 117 mg/dL (ref 100–199)
HDL: 35 mg/dL — ABNORMAL LOW (ref >39)
LDL Chol Calc (NIH): 64 mg/dL (ref 0–99)
Triglycerides: 95 mg/dL (ref 0–149)
VLDL Cholesterol Cal: 18 mg/dL (ref 5–40)

## 2019-12-28 LAB — PSA, TOTAL AND FREE
PSA, Free Pct: 26.9 %
PSA, Free: 0.35 ng/mL
Prostate Specific Ag, Serum: 1.3 ng/mL (ref 0.0–4.0)

## 2019-12-28 LAB — TSH: TSH: 3.23 u[IU]/mL (ref 0.450–4.500)

## 2020-01-07 ENCOUNTER — Other Ambulatory Visit: Payer: Self-pay | Admitting: Family

## 2020-01-07 DIAGNOSIS — I10 Essential (primary) hypertension: Secondary | ICD-10-CM

## 2020-01-10 ENCOUNTER — Other Ambulatory Visit: Payer: Self-pay | Admitting: Family

## 2020-01-10 DIAGNOSIS — I10 Essential (primary) hypertension: Secondary | ICD-10-CM

## 2020-01-12 ENCOUNTER — Other Ambulatory Visit: Payer: Self-pay

## 2020-01-12 ENCOUNTER — Encounter: Payer: Self-pay | Admitting: Family

## 2020-01-12 ENCOUNTER — Ambulatory Visit (INDEPENDENT_AMBULATORY_CARE_PROVIDER_SITE_OTHER): Payer: BC Managed Care – PPO | Admitting: Family

## 2020-01-12 VITALS — BP 135/80 | HR 73 | Temp 96.8°F | Ht 72.0 in | Wt 276.0 lb

## 2020-01-12 DIAGNOSIS — I10 Essential (primary) hypertension: Secondary | ICD-10-CM

## 2020-01-12 NOTE — Progress Notes (Signed)
   Subjective:    Patient ID: Adrian Chaney, male    DOB: [birth year], 59 y.o.   MRN: [id number]  Chief Complaint  Patient presents with  . Hypertension   Pt presents to the office today to recheck HTN. Pt's BP is not at goal today.  Hypertension This is a chronic problem. The current episode started more than 1 year ago. The problem has been resolved since onset. The problem is controlled. Pertinent negatives include no malaise/fatigue, peripheral edema or shortness of breath. Risk factors for coronary artery disease include dyslipidemia, male gender, obesity and sedentary lifestyle. Past treatments include ACE inhibitors and diuretics. The current treatment provides moderate improvement.      Review of Systems  Constitutional: Negative for malaise/fatigue.  Respiratory: Negative for shortness of breath.   All other systems reviewed and are negative.      Objective:   Physical Exam Vitals reviewed.  Constitutional:      General: He is not in acute distress.    Appearance: He is well-developed.  HENT:     Head: Normocephalic.     Right Ear: Tympanic membrane normal.     Left Ear: Tympanic membrane normal.  Eyes:     General:        Right eye: No discharge.        Left eye: No discharge.     Pupils: Pupils are equal, round, and reactive to light.  Neck:     Thyroid: No thyromegaly.  Cardiovascular:     Rate and Rhythm: Normal rate and regular rhythm.     Heart sounds: Normal heart sounds. No murmur.  Pulmonary:     Effort: Pulmonary effort is normal. No respiratory distress.     Breath sounds: Normal breath sounds. No wheezing.  Abdominal:     General: Bowel sounds are normal. There is no distension.     Palpations: Abdomen is soft.     Tenderness: There is no abdominal tenderness.  Musculoskeletal:        General: No tenderness. Normal range of motion.     Cervical back: Normal range of motion and neck supple.  Skin:    General: Skin is warm and dry.     Findings:  No erythema or rash.  Neurological:     Mental Status: He is alert and oriented to person, place, and time.     Cranial Nerves: No cranial nerve deficit.     Deep Tendon Reflexes: Reflexes are normal and symmetric.  Psychiatric:        Behavior: Behavior normal.        Thought Content: Thought content normal.        Judgment: Judgment normal.       BP (!) 147/90   Pulse 71   Temp (!) 96.8 F (36 C) (Temporal)   Ht 6' (1.829 m)   Wt 276 lb (125.2 kg)   SpO2 96%   BMI 37.43 kg/m      Assessment & Plan:  Adrian Chaney comes in today with chief complaint of Hypertension   Diagnosis and orders addressed:  1. Essential hypertension -Daily blood pressure log given with instructions on how to fill out and told to bring to next visit -Dash diet information given -Exercise encouraged - Stress Management  -Continue current meds -RTO in  6 months     Evelina Dun, FNP

## 2020-01-12 NOTE — Patient Instructions (Signed)

## 2020-01-13 LAB — CMP14+EGFR
ALT: 80 IU/L — ABNORMAL HIGH (ref 0–44)
AST: 55 IU/L — ABNORMAL HIGH (ref 0–40)
Albumin/Globulin Ratio: 1.8 (ref 1.2–2.2)
Albumin: 4.5 g/dL (ref 3.8–4.9)
Alkaline Phosphatase: 76 IU/L (ref 39–117)
BUN/Creatinine Ratio: 14 (ref 9–20)
BUN: 14 mg/dL (ref 6–24)
Bilirubin Total: 0.4 mg/dL (ref 0.0–1.2)
CO2: 22 mmol/L (ref 20–29)
Calcium: 9.5 mg/dL (ref 8.7–10.2)
Chloride: 101 mmol/L (ref 96–106)
Creatinine, Ser: 1.02 mg/dL (ref 0.76–1.27)
GFR calc Af Amer: 94 mL/min/{1.73_m2} (ref >59)
GFR calc non Af Amer: 81 mL/min/{1.73_m2} (ref >59)
Globulin, Total: 2.5 g/dL (ref 1.5–4.5)
Glucose: 96 mg/dL (ref 65–99)
Potassium: 4.1 mmol/L (ref 3.5–5.2)
Sodium: 139 mmol/L (ref 134–144)
Total Protein: 7 g/dL (ref 6.0–8.5)

## 2020-01-14 ENCOUNTER — Telehealth: Payer: Self-pay | Admitting: Family

## 2020-01-14 ENCOUNTER — Other Ambulatory Visit: Payer: Self-pay | Admitting: Family

## 2020-01-14 DIAGNOSIS — R748 Abnormal levels of other serum enzymes: Secondary | ICD-10-CM

## 2020-01-14 NOTE — Telephone Encounter (Signed)
Patient returned call regarding lab work.  Results were given to patient and appt made for 2 month f/u.  Explained to patient he should hear something from scheduling about his US abdominal.  Patient verbalized understanding.

## 2020-01-20 ENCOUNTER — Other Ambulatory Visit: Payer: Self-pay

## 2020-01-20 ENCOUNTER — Ambulatory Visit (HOSPITAL_COMMUNITY)
Admission: RE | Admit: 2020-01-20 | Discharge: 2020-01-20 | Disposition: A | Discharge status: Home / Self Care | Payer: BC Managed Care – PPO | Source: Ambulatory Visit | Attending: Family | Admitting: Family

## 2020-01-20 DIAGNOSIS — R748 Abnormal levels of other serum enzymes: Secondary | ICD-10-CM | POA: Diagnosis not present

## 2020-01-20 DIAGNOSIS — K760 Fatty (change of) liver, not elsewhere classified: Secondary | ICD-10-CM | POA: Diagnosis not present

## 2020-03-14 ENCOUNTER — Ambulatory Visit: Payer: BC Managed Care – PPO | Admitting: Family

## 2020-03-28 ENCOUNTER — Ambulatory Visit: Payer: BC Managed Care – PPO | Admitting: Family

## 2020-03-28 ENCOUNTER — Encounter: Payer: Self-pay | Admitting: Family

## 2020-04-06 IMAGING — RF DG ANKLE COMPLETE 3+V*R*
1 series · 4 of 4 positions shown · non-contrast
Comparison: Right ankle x-rays dated August 03, 2018.

CLINICAL DATA: Distal fibular fracture ORIF.

EXAM:
DG C-ARM 61-120 MIN; RIGHT ANKLE - COMPLETE 3+ VIEW

[Series 1: run · 4 of 4 slices shown]
[im 1/4]
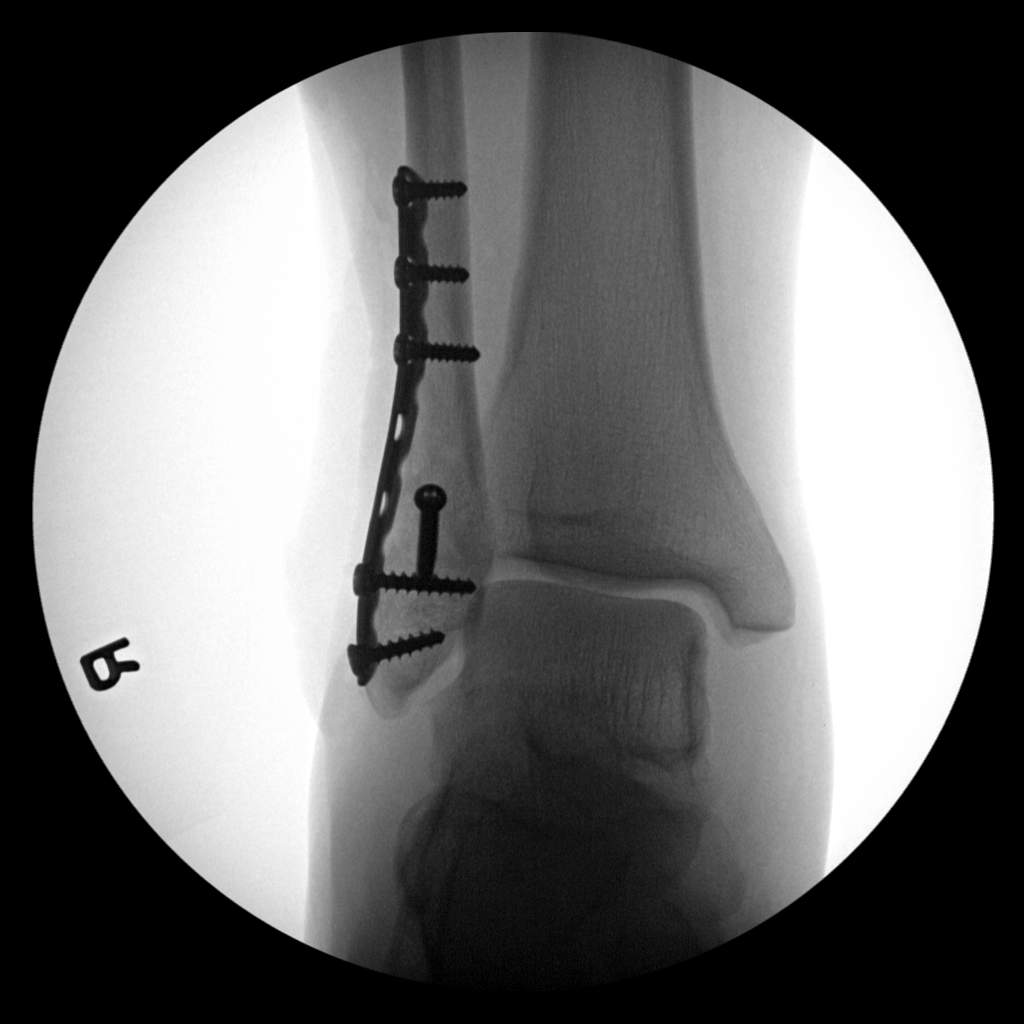
[im 2/4]
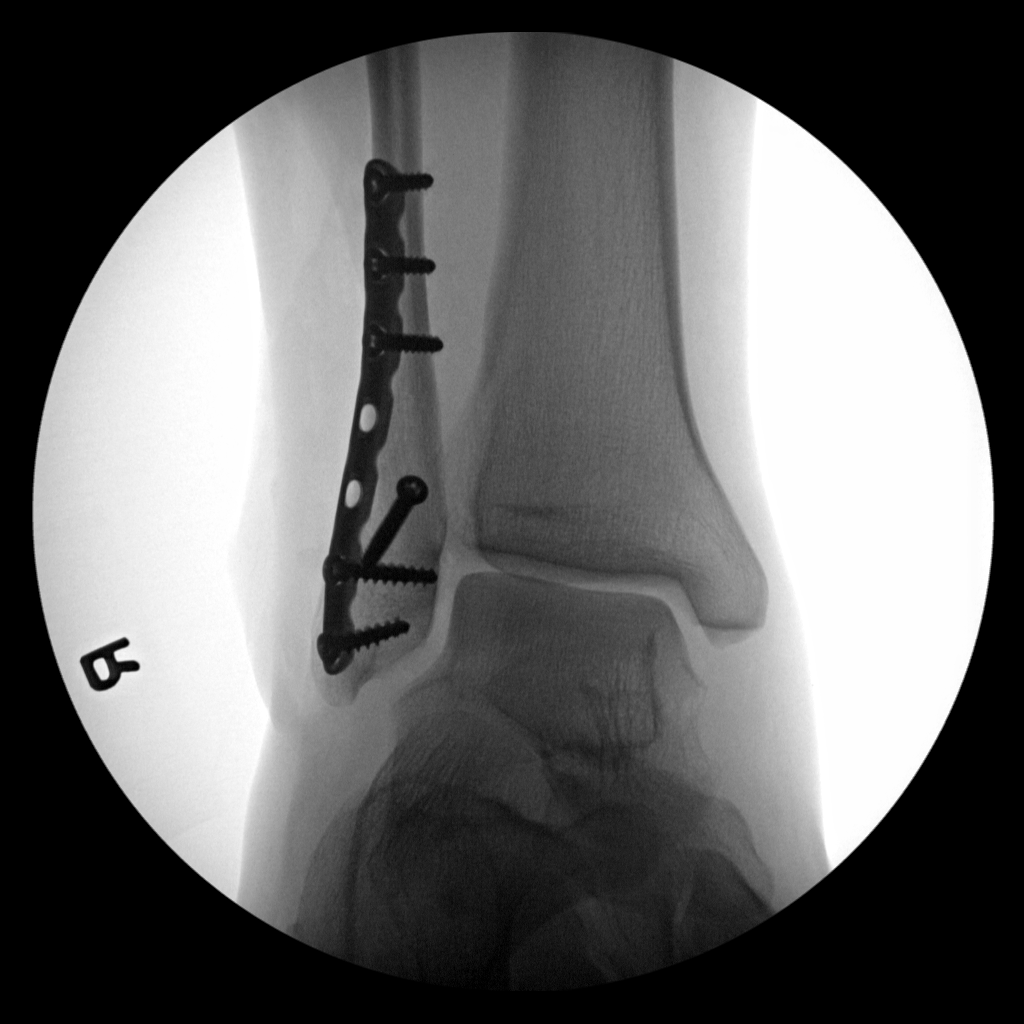
[im 3/4]
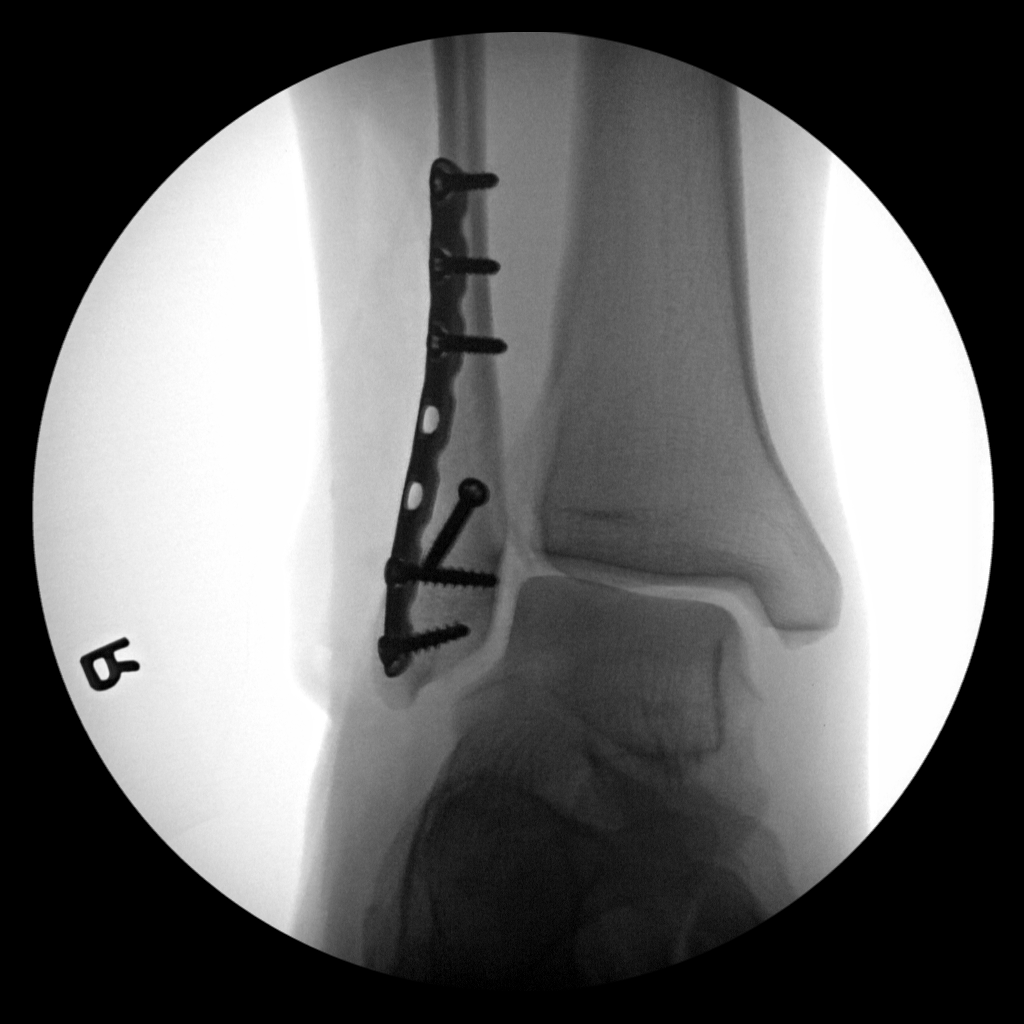
[im 4/4]
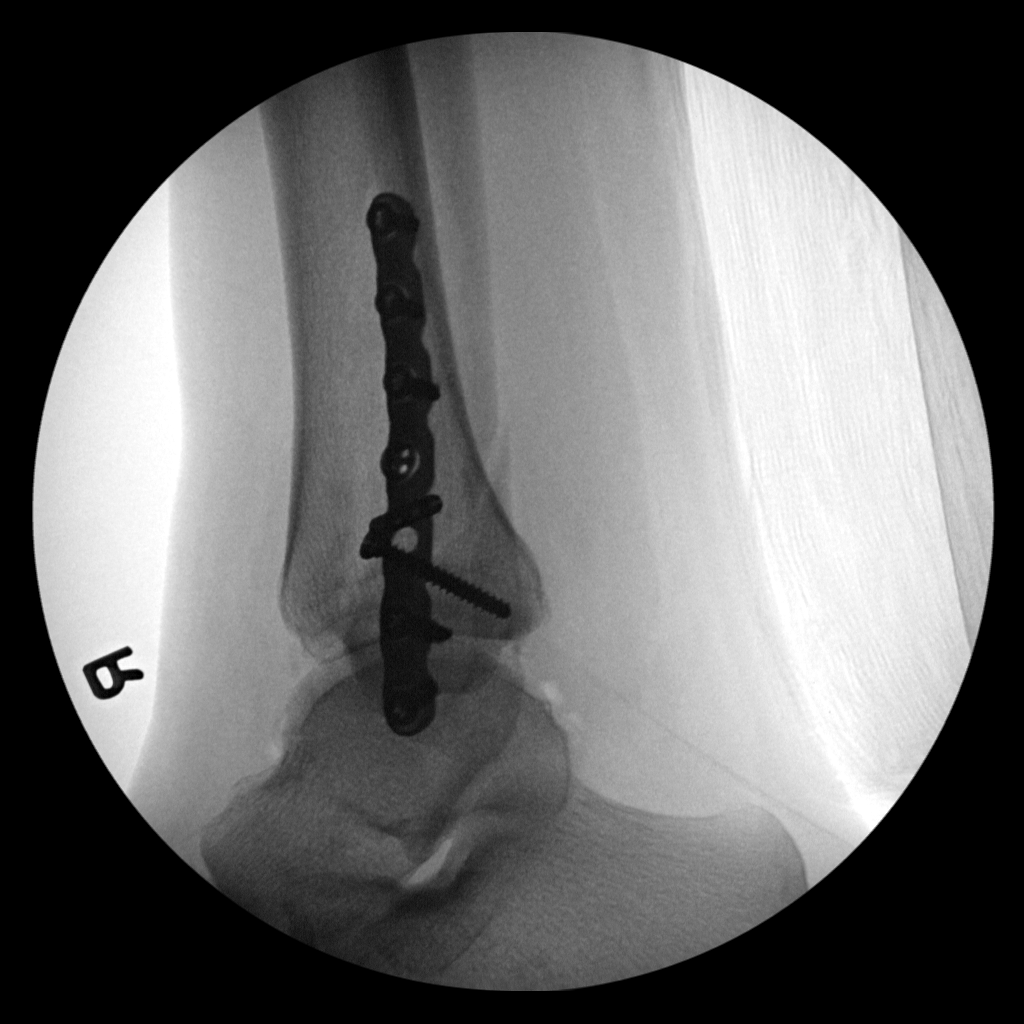

[4 of 4 positions shown; findings below may reference images not displayed]

FLUOROSCOPY TIME:  22 seconds.

C-arm fluoroscopic images were obtained intraoperatively and
submitted for post operative interpretation.
FINDINGS: Multiple intraoperative fluoroscopic images demonstrate interval
lateral plate and screw fixation of the distal fibular fracture.
Alignment is anatomic. No evidence of hardware failure or loosening.
The ankle mortise is symmetric. The talar dome is intact. Expected
soft tissue swelling.
IMPRESSION: 1. Intraoperative fluoroscopic guidance for distal fibular fracture
ORIF.

## 2020-06-30 ENCOUNTER — Encounter: Payer: Self-pay | Admitting: *Deleted

## 2020-07-03 ENCOUNTER — Other Ambulatory Visit: Payer: Self-pay | Admitting: Family

## 2020-07-03 DIAGNOSIS — I10 Essential (primary) hypertension: Secondary | ICD-10-CM

## 2020-07-10 ENCOUNTER — Telehealth: Payer: Self-pay

## 2020-07-10 DIAGNOSIS — I10 Essential (primary) hypertension: Secondary | ICD-10-CM

## 2020-07-10 MED ORDER — HYDROCHLOROTHIAZIDE 25 MG PO TABS: 25.0000 mg | ORAL_TABLET | Freq: Every day | ORAL | 3 refills | Status: DC

## 2020-07-10 NOTE — Telephone Encounter (Signed)
  Prescription Request  07/10/2020  What is the name of the medication or equipment? hydrochlorothiazide (HYDRODIURIL) 25 MG tablet  Have you contacted your pharmacy to request a refill? (if applicable) yes [facility] next apt 11/18/201  Which pharmacy would you like this sent to? [facility].   Patient notified that their request is being sent to the clinical staff for review and that they should receive a response within 2 business days.

## 2020-07-10 NOTE — Telephone Encounter (Signed)
Refills sent

## 2020-07-14 ENCOUNTER — Ambulatory Visit: Payer: BC Managed Care – PPO | Admitting: Family

## 2020-07-27 ENCOUNTER — Ambulatory Visit (INDEPENDENT_AMBULATORY_CARE_PROVIDER_SITE_OTHER): Payer: BC Managed Care – PPO | Admitting: Family

## 2020-07-27 ENCOUNTER — Other Ambulatory Visit: Payer: Self-pay

## 2020-07-27 ENCOUNTER — Encounter: Payer: Self-pay | Admitting: Family

## 2020-07-27 VITALS — BP 127/73 | HR 79 | Temp 97.0°F | Ht 72.0 in | Wt 262.4 lb

## 2020-07-27 DIAGNOSIS — E785 Hyperlipidemia, unspecified: Secondary | ICD-10-CM

## 2020-07-27 DIAGNOSIS — I10 Essential (primary) hypertension: Secondary | ICD-10-CM | POA: Diagnosis not present

## 2020-07-27 DIAGNOSIS — K219 Gastro-esophageal reflux disease without esophagitis: Secondary | ICD-10-CM | POA: Diagnosis not present

## 2020-07-27 DIAGNOSIS — E669 Obesity, unspecified: Secondary | ICD-10-CM | POA: Diagnosis not present

## 2020-07-27 DIAGNOSIS — K760 Fatty (change of) liver, not elsewhere classified: Secondary | ICD-10-CM | POA: Insufficient documentation

## 2020-07-27 MED ORDER — ROSUVASTATIN CALCIUM 5 MG PO TABS: 5.0000 mg | ORAL_TABLET | ORAL | 3 refills | Status: DC

## 2020-07-27 NOTE — Patient Instructions (Signed)
Fat and Cholesterol Restricted Eating Plan Eating a diet that limits fat and cholesterol may help lower your risk for heart disease and other conditions. Your body needs fat and cholesterol for basic functions, but eating too much of these things can be harmful to your health. Your health care provider may order lab tests to check your blood fat (lipid) and cholesterol levels. This helps your health care provider understand your risk for certain conditions and whether you need to make diet changes. Work with your health care provider or dietitian to make an eating plan that is right for you. Your plan includes:  Limit your fat intake to ______% or less of your total calories a day.  Limit your saturated fat intake to ______% or less of your total calories a day.  Limit the amount of cholesterol in your diet to less than _________mg a day.  Eat ___________ g of fiber a day. What are tips for following this plan? General guidelines   If you are overweight, work with your health care provider to lose weight safely. Losing just 5-10% of your body weight can improve your overall health and help prevent diseases such as diabetes and heart disease.  Avoid: ? Foods with added sugar. ? Fried foods. ? Foods that contain partially hydrogenated oils, including stick margarine, some tub margarines, cookies, crackers, and other baked goods.  Limit alcohol intake to no more than 1 drink a day for nonpregnant women and 2 drinks a day for men. One drink equals 12 oz of beer, 5 oz of wine, or 1 oz of hard liquor. Reading food labels  Check food labels for: ? Trans fats, partially hydrogenated oils, or high amounts of saturated fat. Avoid foods that contain saturated fat and trans fat. ? The amount of cholesterol in each serving. Try to eat no more than 200 mg of cholesterol each day. ? The amount of fiber in each serving. Try to eat at least 20-30 g of fiber each day.  Choose foods with healthy fats,  such as: ? Monounsaturated and polyunsaturated fats. These include olive and canola oil, flaxseeds, walnuts, almonds, and seeds. ? Omega-3 fats. These are found in foods such as salmon, mackerel, sardines, tuna, flaxseed oil, and ground flaxseeds.  Choose grain products that have whole grains. Look for the word "whole" as the first word in the ingredient list. Cooking  Cook foods using methods other than frying. Baking, boiling, grilling, and broiling are some healthy options.  Eat more home-cooked food and less restaurant, buffet, and fast food.  Avoid cooking using saturated fats. ? Animal sources of saturated fats include meats, butter, and cream. ? Plant sources of saturated fats include palm oil, palm kernel oil, and coconut oil. Meal planning   At meals, imagine dividing your plate into fourths: ? Fill one-half of your plate with vegetables and green salads. ? Fill one-fourth of your plate with whole grains. ? Fill one-fourth of your plate with lean protein foods.  Eat fish that is high in omega-3 fats at least two times a week.  Eat more foods that contain fiber, such as whole grains, beans, apples, broccoli, carrots, peas, and barley. These foods help promote healthy cholesterol levels in the blood. Recommended foods Grains  Whole grains, such as whole wheat or whole grain breads, crackers, cereals, and pasta. Unsweetened oatmeal, bulgur, barley, quinoa, or brown rice. Corn or whole wheat flour tortillas. Vegetables  Fresh or frozen vegetables (raw, steamed, roasted, or grilled). Green salads. Fruits  All fresh, canned (in natural juice), or frozen fruits. Meats and other protein foods  Ground beef (85% or leaner), grass-fed beef, or beef trimmed of fat. Skinless chicken or [place]. Ground chicken or [place]. Pork trimmed of fat. All fish and seafood. Egg whites. Dried beans, peas, or lentils. Unsalted nuts or seeds. Unsalted canned beans. Natural nut butters without added  sugar and oil. Dairy  Low-fat or nonfat dairy products, such as skim or 1% milk, 2% or reduced-fat cheeses, low-fat and fat-free ricotta or cottage cheese, or plain low-fat and nonfat yogurt. Fats and oils  Tub margarine without trans fats. Light or reduced-fat mayonnaise and salad dressings. Avocado. Olive, canola, sesame, or safflower oils. The items listed above may not be a complete list of recommended foods or beverages. Contact your dietitian for more options. Foods to avoid Grains  White bread. White pasta. White rice. Cornbread. Bagels, pastries, and croissants. Crackers and snack foods that contain trans fat and hydrogenated oils. Vegetables  Vegetables cooked in cheese, cream, or butter sauce. Fried vegetables. Fruits  Canned fruit in heavy syrup. Fruit in cream or butter sauce. Fried fruit. Meats and other protein foods  Fatty cuts of meat. Ribs, chicken wings, bacon, sausage, bologna, salami, chitterlings, fatback, hot dogs, bratwurst, and packaged lunch meats. Liver and organ meats. Whole eggs and egg yolks. Chicken and [place] with skin. Fried meat. Dairy  Whole or 2% milk, cream, half-and-half, and cream cheese. Whole milk cheeses. Whole-fat or sweetened yogurt. Full-fat cheeses. Nondairy creamers and whipped toppings. Processed cheese, cheese spreads, and cheese curds. Beverages  Alcohol. Sugar-sweetened drinks such as sodas, lemonade, and fruit drinks. Fats and oils  Butter, stick margarine, lard, shortening, ghee, or bacon fat. Coconut, palm kernel, and palm oils. Sweets and desserts  Corn syrup, sugars, honey, and molasses. Candy. Jam and jelly. Syrup. Sweetened cereals. Cookies, pies, cakes, donuts, muffins, and ice cream. The items listed above may not be a complete list of foods and beverages to avoid. Contact your dietitian for more information. Summary  Your body needs fat and cholesterol for basic functions. However, eating too much of these things can be  harmful to your health.  Work with your health care provider and dietitian to follow a diet low in fat and cholesterol. Doing this may help lower your risk for heart disease and other conditions.  Choose healthy fats, such as monounsaturated and polyunsaturated fats, and foods high in omega-3 fatty acids.  Eat fiber-rich foods, such as whole grains, beans, peas, fruits, and vegetables.  Limit or avoid alcohol, fried foods, and foods high in saturated fats, partially hydrogenated oils, and sugar. This information is not intended to replace advice given to you by your health care provider. Make sure you discuss any questions you have with your health care provider. Document Revised: 08/08/2017 Document Reviewed: 05/13/2017 Elsevier Patient Education  [place]. Fatty Liver Disease  Fatty liver disease occurs when too much fat has built up in your liver cells. Fatty liver disease is also called hepatic steatosis or steatohepatitis. The liver removes harmful substances from your bloodstream and produces fluids that your body needs. It also helps your body use and store energy from the food you eat. In many cases, fatty liver disease does not cause symptoms or problems. It is often diagnosed when tests are being done for other reasons. However, over time, fatty liver can cause inflammation that may lead to more serious liver problems, such as scarring of the liver (cirrhosis) and liver failure.  Fatty liver is associated with insulin resistance, increased body fat, high blood pressure (hypertension), and high cholesterol. These are features of metabolic syndrome and increase your risk for stroke, diabetes, and heart disease. What are the causes? This condition may be caused by:  Drinking too much alcohol.  Poor nutrition.  Obesity.  Cushing's syndrome.  Diabetes.  High cholesterol.  Certain drugs.  Poisons.  Some viral infections.  Pregnancy. What increases the risk? You  are more likely to develop this condition if you:  Abuse alcohol.  Are overweight.  Have diabetes.  Have hepatitis.  Have a high triglyceride level.  Are pregnant. What are the signs or symptoms? Fatty liver disease often does not cause symptoms. If symptoms do develop, they can include:  Fatigue.  Weakness.  Weight loss.  Confusion.  Abdominal pain.  Nausea and vomiting.  Yellowing of your skin and the white parts of your eyes (jaundice).  Itchy skin. How is this diagnosed? This condition may be diagnosed by:  A physical exam and medical history.  Blood tests.  Imaging tests, such as an ultrasound, CT scan, or MRI.  A liver biopsy. A small sample of liver tissue is removed using a needle. The sample is then looked at under a microscope. How is this treated? Fatty liver disease is often caused by other health conditions. Treatment for fatty liver may involve medicines and lifestyle changes to manage conditions such as:  Alcoholism.  High cholesterol.  Diabetes.  Being overweight or obese. Follow these instructions at home:   Do not drink alcohol. If you have trouble quitting, ask your health care provider how to safely quit with the help of medicine or a supervised program. This is important to keep your condition from getting worse.  Eat a healthy diet as told by your health care provider. Ask your health care provider about working with a diet and nutrition specialist (dietitian) to develop an eating plan.  Exercise regularly. This can help you lose weight and control your cholesterol and diabetes. Talk to your health care provider about an exercise plan and which activities are best for you.  Take over-the-counter and prescription medicines only as told by your health care provider.  Keep all follow-up visits as told by your health care provider. This is important. Contact a health care provider if: You have trouble controlling your:  Blood sugar.  This is especially important if you have diabetes.  Cholesterol.  Drinking of alcohol. Get help right away if:  You have abdominal pain.  You have jaundice.  You have nausea and vomiting.  You vomit blood or material that looks like coffee grounds.  You have stools that are black, tar-like, or bloody. Summary  Fatty liver disease develops when too much fat builds up in the cells of your liver.  Fatty liver disease often causes no symptoms or problems. However, over time, fatty liver can cause inflammation that may lead to more serious liver problems, such as scarring of the liver (cirrhosis).  You are more likely to develop this condition if you abuse alcohol, are pregnant, are overweight, have diabetes, have hepatitis, or have high triglyceride levels.  Contact your health care provider if you have trouble controlling your weight, blood sugar, cholesterol, or drinking of alcohol. This information is not intended to replace advice given to you by your health care provider. Make sure you discuss any questions you have with your health care provider. Document Revised: 08/08/2017 Document Reviewed: 06/04/2017 Elsevier Patient Education  [phone]  Elsevier Inc. ° °

## 2020-07-27 NOTE — Progress Notes (Signed)
Subjective:    Patient ID: Adrian Chaney, male    DOB: 1961/04/02, 59 y.o.   MRN: [id number]  Chief Complaint  Patient presents with  . Hypertension   Pt presents to the office today for chronic.  Hypertension This is a chronic problem. The current episode started more than 1 year ago. The problem has been resolved since onset. The problem is controlled. Pertinent negatives include no malaise/fatigue, peripheral edema or shortness of breath. Risk factors for coronary artery disease include dyslipidemia, male gender, obesity, smoking/tobacco exposure and sedentary lifestyle. The current treatment provides moderate improvement. There is no history of CVA or heart failure.  Gastroesophageal Reflux He complains of belching and heartburn. This is a chronic problem. The current episode started more than 1 year ago. The problem occurs occasionally. The problem has been waxing and waning. Risk factors include obesity. He has tried a PPI for the symptoms. The treatment provided moderate relief.  Hyperlipidemia This is a chronic problem. The current episode started more than 1 year ago. The problem is controlled. Exacerbating diseases include obesity. Pertinent negatives include no shortness of breath. The current treatment provides mild improvement of lipids. Risk factors for coronary artery disease include dyslipidemia, male sex, hypertension and a sedentary lifestyle.      Review of Systems  Constitutional: Negative for malaise/fatigue.  Respiratory: Negative for shortness of breath.   Gastrointestinal: Positive for heartburn.  All other systems reviewed and are negative.      Objective:   Physical Exam Vitals reviewed.  Constitutional:      General: He is not in acute distress.    Appearance: He is well-developed.  HENT:     Head: Normocephalic.     Right Ear: Tympanic membrane normal.     Left Ear: Tympanic membrane normal.  Eyes:     General:        Right eye: No discharge.         Left eye: No discharge.     Pupils: Pupils are equal, round, and reactive to light.  Neck:     Thyroid: No thyromegaly.  Cardiovascular:     Rate and Rhythm: Normal rate and regular rhythm.     Heart sounds: Normal heart sounds. No murmur heard.   Pulmonary:     Effort: Pulmonary effort is normal. No respiratory distress.     Breath sounds: Normal breath sounds. No wheezing.  Abdominal:     General: Bowel sounds are normal. There is no distension.     Palpations: Abdomen is soft.     Tenderness: There is no abdominal tenderness.  Musculoskeletal:        General: No tenderness. Normal range of motion.     Cervical back: Normal range of motion and neck supple.  Skin:    General: Skin is warm and dry.     Findings: No erythema or rash.  Neurological:     Mental Status: He is alert and oriented to person, place, and time.     Cranial Nerves: No cranial nerve deficit.     Deep Tendon Reflexes: Reflexes are normal and symmetric.  Psychiatric:        Behavior: Behavior normal.        Thought Content: Thought content normal.        Judgment: Judgment normal.      BP 127/73   Pulse 79   Temp (!) 97 F (36.1 C) (Temporal)   Ht 6' (1.829 m)   Wt 262 lb  6.4 oz (119 kg)   SpO2 96%   BMI 35.59 kg/m       Assessment & Plan:  Adrian Chaney comes in today with chief complaint of Hypertension   Diagnosis and orders addressed:  1. Essential hypertension - BMP8+EGFR - Hepatic function panel  2. Gastroesophageal reflux disease, unspecified whether esophagitis present - BMP8+EGFR - Hepatic function panel  3. Obesity (BMI 30-39.9) - BMP8+EGFR - Hepatic function panel  4. Hyperlipidemia, unspecified hyperlipidemia type -Will stop Lipitor  And start Crestor - BMP8+EGFR - Hepatic function panel - rosuvastatin (CRESTOR) 5 MG tablet; Take 1 tablet (5 mg total) by mouth 2 (two) times a week.  Dispense: 24 tablet; Refill: 3  5. Fatty liver - BMP8+EGFR - Hepatic function  panel   Labs pending Health Maintenance reviewed Diet and exercise encouraged  Follow up plan: 6 months    Adrian Dun, FNP

## 2020-07-28 LAB — BMP8+EGFR
BUN/Creatinine Ratio: 17 (ref 9–20)
BUN: 19 mg/dL (ref 6–24)
CO2: 23 mmol/L (ref 20–29)
Calcium: 10 mg/dL (ref 8.7–10.2)
Chloride: 100 mmol/L (ref 96–106)
Creatinine, Ser: 1.1 mg/dL (ref 0.76–1.27)
GFR calc Af Amer: 85 mL/min/{1.73_m2} (ref >59)
GFR calc non Af Amer: 74 mL/min/{1.73_m2} (ref >59)
Glucose: 98 mg/dL (ref 65–99)
Potassium: 4.4 mmol/L (ref 3.5–5.2)
Sodium: 139 mmol/L (ref 134–144)

## 2020-07-28 LAB — HEPATIC FUNCTION PANEL
ALT: 71 IU/L — ABNORMAL HIGH (ref 0–44)
AST: 44 IU/L — ABNORMAL HIGH (ref 0–40)
Albumin: 4.6 g/dL (ref 3.8–4.9)
Alkaline Phosphatase: 57 IU/L (ref 44–121)
Bilirubin Total: 0.3 mg/dL (ref 0.0–1.2)
Bilirubin, Direct: <0.1 mg/dL (ref 0.00–0.40)
Total Protein: 6.8 g/dL (ref 6.0–8.5)

## 2020-10-09 ENCOUNTER — Other Ambulatory Visit: Payer: Self-pay | Admitting: Family

## 2020-10-09 DIAGNOSIS — I10 Essential (primary) hypertension: Secondary | ICD-10-CM

## 2021-01-26 ENCOUNTER — Other Ambulatory Visit: Payer: Self-pay

## 2021-01-26 ENCOUNTER — Ambulatory Visit (INDEPENDENT_AMBULATORY_CARE_PROVIDER_SITE_OTHER): Payer: BC Managed Care – PPO | Admitting: Family

## 2021-01-26 ENCOUNTER — Encounter: Payer: Self-pay | Admitting: Family

## 2021-01-26 VITALS — BP 139/85 | HR 75 | Temp 97.7°F | Ht 72.0 in | Wt 275.0 lb

## 2021-01-26 DIAGNOSIS — Z0000 Encounter for general adult medical examination without abnormal findings: Secondary | ICD-10-CM

## 2021-01-26 DIAGNOSIS — E669 Obesity, unspecified: Secondary | ICD-10-CM

## 2021-01-26 DIAGNOSIS — K219 Gastro-esophageal reflux disease without esophagitis: Secondary | ICD-10-CM

## 2021-01-26 DIAGNOSIS — E785 Hyperlipidemia, unspecified: Secondary | ICD-10-CM | POA: Diagnosis not present

## 2021-01-26 DIAGNOSIS — I10 Essential (primary) hypertension: Secondary | ICD-10-CM

## 2021-01-26 DIAGNOSIS — K760 Fatty (change of) liver, not elsewhere classified: Secondary | ICD-10-CM

## 2021-01-26 NOTE — Patient Instructions (Signed)

## 2021-01-26 NOTE — Progress Notes (Signed)
Subjective:    Patient ID: Adrian Chaney, male    DOB: 12-Jul-1961, 60 y.o.   MRN: [id number]  Chief Complaint  Patient presents with  . Hypertension    6 mth     Pt presents to the office today for chronic follow up.  Hypertension This is a chronic problem. The current episode started more than 1 year ago. The problem has been resolved since onset. The problem is controlled. Pertinent negatives include no malaise/fatigue, peripheral edema or shortness of breath. Risk factors for coronary artery disease include dyslipidemia, obesity, male gender and sedentary lifestyle. The current treatment provides moderate improvement.  Gastroesophageal Reflux He complains of belching and heartburn. This is a chronic problem. The current episode started more than 1 year ago. The problem occurs occasionally. The problem has been waxing and waning. Risk factors include obesity. He has tried a PPI for the symptoms.  Hyperlipidemia This is a chronic problem. The current episode started more than 1 year ago. The problem is controlled. Recent lipid tests were reviewed and are normal. Exacerbating diseases include obesity. Pertinent negatives include no shortness of breath. Current antihyperlipidemic treatment includes statins. The current treatment provides moderate improvement of lipids. Risk factors for coronary artery disease include dyslipidemia, male sex, hypertension and a sedentary lifestyle.      Review of Systems  Constitutional: Negative for malaise/fatigue.  Respiratory: Negative for shortness of breath.   Gastrointestinal: Positive for heartburn.  All other systems reviewed and are negative.  Family History  Problem Relation Age of Onset  . Hypertension Mother   . Diabetes Father   . Cancer Father        colon  . Colon cancer Father   . Scoliosis Brother   . Esophageal cancer Neg Hx   . Rectal cancer Neg Hx   . Stomach cancer Neg Hx    Social History   Socioeconomic History  .  Marital status: Single    Spouse name: Not on file  . Number of children: Not on file  . Years of education: Not on file  . Highest education level: Not on file  Occupational History  . Not on file  Tobacco Use  . Smoking status: Former Research scientist (life sciences)  . Smokeless tobacco: Never Used  Vaping Use  . Vaping Use: Never used  Substance and Sexual Activity  . Alcohol use: Not Currently    Alcohol/week: 1.0 - 2.0 standard drink    Types: 1 - 2 Cans of beer per week  . Drug use: Yes    Frequency: 1.0 times per week    Types: Marijuana    Comment: 1 joint a week.   Marland Kitchen Sexual activity: Not on file  Other Topics Concern  . Not on file  Social History Narrative  . Not on file   Social Determinants of Health   Financial Resource Strain: Not on file  Food Insecurity: Not on file  Transportation Needs: Not on file  Physical Activity: Not on file  Stress: Not on file  Social Connections: Not on file       Objective:   Physical Exam Vitals reviewed.  Constitutional:      General: He is not in acute distress.    Appearance: He is well-developed.  HENT:     Head: Normocephalic.     Right Ear: Tympanic membrane normal.     Left Ear: Tympanic membrane normal.  Eyes:     General:        Right eye:  No discharge.        Left eye: No discharge.     Pupils: Pupils are equal, round, and reactive to light.  Neck:     Thyroid: No thyromegaly.  Cardiovascular:     Rate and Rhythm: Normal rate and regular rhythm.     Heart sounds: Normal heart sounds. No murmur heard.   Pulmonary:     Effort: Pulmonary effort is normal. No respiratory distress.     Breath sounds: Normal breath sounds. No wheezing.  Abdominal:     General: Bowel sounds are normal. There is no distension.     Palpations: Abdomen is soft.     Tenderness: There is no abdominal tenderness.  Musculoskeletal:        General: No tenderness. Normal range of motion.     Cervical back: Normal range of motion and neck supple.   Skin:    General: Skin is warm and dry.     Findings: No erythema or rash.  Neurological:     Mental Status: He is alert and oriented to person, place, and time.     Cranial Nerves: No cranial nerve deficit.     Deep Tendon Reflexes: Reflexes are normal and symmetric.  Psychiatric:        Behavior: Behavior normal.        Thought Content: Thought content normal.        Judgment: Judgment normal.       BP 139/85   Pulse 75   Temp 97.7 F (36.5 C)   Ht 6' (1.829 m)   Wt 275 lb (124.7 kg)   BMI 37.30 kg/m      Assessment & Plan:  Adrian Chaney comes in today with chief complaint of Hypertension (6 mth )   Diagnosis and orders addressed:  1. Annual physical exam - CMP14+EGFR - CBC with Differential/Platelet - Lipid panel - PSA, total and free - TSH  2. Essential hypertension - CMP14+EGFR - CBC with Differential/Platelet  3. Gastroesophageal reflux disease, unspecified whether esophagitis present - CMP14+EGFR - CBC with Differential/Platelet  4. Fatty liver - CMP14+EGFR - CBC with Differential/Platelet  5. Obesity (BMI 30-39.9) - CMP14+EGFR - CBC with Differential/Platelet  6. Hyperlipidemia, unspecified hyperlipidemia type - CMP14+EGFR - CBC with Differential/Platelet   Labs pending Health Maintenance reviewed Diet and exercise encouraged  Follow up plan: 6 months    Evelina Dun, FNP

## 2021-01-27 LAB — LIPID PANEL
Chol/HDL Ratio: 6.9 ratio — ABNORMAL HIGH (ref 0.0–5.0)
Cholesterol, Total: 187 mg/dL (ref 100–199)
HDL: 27 mg/dL — ABNORMAL LOW (ref >39)
LDL Chol Calc (NIH): 92 mg/dL (ref 0–99)
Triglycerides: 412 mg/dL — ABNORMAL HIGH (ref 0–149)
VLDL Cholesterol Cal: 68 mg/dL — ABNORMAL HIGH (ref 5–40)

## 2021-01-27 LAB — CBC WITH DIFFERENTIAL/PLATELET
Basophils Absolute: 0.1 10*3/uL (ref 0.0–0.2)
Basos: 1 %
EOS (ABSOLUTE): 0.2 10*3/uL (ref 0.0–0.4)
Eos: 2 %
Hematocrit: 45.3 % (ref 37.5–51.0)
Hemoglobin: 15.5 g/dL (ref 13.0–17.7)
Immature Grans (Abs): 0 10*3/uL (ref 0.0–0.1)
Immature Granulocytes: 0 %
Lymphocytes Absolute: 2.1 10*3/uL (ref 0.7–3.1)
Lymphs: 25 %
MCH: 32 pg (ref 26.6–33.0)
MCHC: 34.2 g/dL (ref 31.5–35.7)
MCV: 93 fL (ref 79–97)
Monocytes Absolute: 0.6 10*3/uL (ref 0.1–0.9)
Monocytes: 7 %
Neutrophils Absolute: 5.4 10*3/uL (ref 1.4–7.0)
Neutrophils: 65 %
Platelets: 203 10*3/uL (ref 150–450)
RBC: 4.85 x10E6/uL (ref 4.14–5.80)
RDW: 12.1 % (ref 11.6–15.4)
WBC: 8.3 10*3/uL (ref 3.4–10.8)

## 2021-01-27 LAB — CMP14+EGFR
ALT: 80 IU/L — ABNORMAL HIGH (ref 0–44)
AST: 50 IU/L — ABNORMAL HIGH (ref 0–40)
Albumin/Globulin Ratio: 2.2 (ref 1.2–2.2)
Albumin: 4.7 g/dL (ref 3.8–4.9)
Alkaline Phosphatase: 50 IU/L (ref 44–121)
BUN/Creatinine Ratio: 15 (ref 9–20)
BUN: 14 mg/dL (ref 6–24)
Bilirubin Total: 0.3 mg/dL (ref 0.0–1.2)
CO2: 22 mmol/L (ref 20–29)
Calcium: 9.5 mg/dL (ref 8.7–10.2)
Chloride: 98 mmol/L (ref 96–106)
Creatinine, Ser: 0.95 mg/dL (ref 0.76–1.27)
Globulin, Total: 2.1 g/dL (ref 1.5–4.5)
Glucose: 121 mg/dL — ABNORMAL HIGH (ref 65–99)
Potassium: 4.2 mmol/L (ref 3.5–5.2)
Sodium: 137 mmol/L (ref 134–144)
Total Protein: 6.8 g/dL (ref 6.0–8.5)
eGFR: 93 mL/min/{1.73_m2} (ref >59)

## 2021-01-27 LAB — PSA, TOTAL AND FREE
PSA, Free Pct: 25.3 %
PSA, Free: 0.43 ng/mL
Prostate Specific Ag, Serum: 1.7 ng/mL (ref 0.0–4.0)

## 2021-01-27 LAB — TSH: TSH: 1.84 u[IU]/mL (ref 0.450–4.500)

## 2021-01-29 ENCOUNTER — Other Ambulatory Visit: Payer: Self-pay | Admitting: Family

## 2021-01-29 DIAGNOSIS — R748 Abnormal levels of other serum enzymes: Secondary | ICD-10-CM

## 2021-02-27 ENCOUNTER — Encounter: Payer: Self-pay | Admitting: *Deleted

## 2021-04-11 ENCOUNTER — Other Ambulatory Visit: Payer: Self-pay | Admitting: Family

## 2021-04-11 DIAGNOSIS — I10 Essential (primary) hypertension: Secondary | ICD-10-CM

## 2021-05-04 ENCOUNTER — Ambulatory Visit: Payer: BC Managed Care – PPO | Admitting: Physician Assistant

## 2021-07-06 ENCOUNTER — Ambulatory Visit: Payer: BC Managed Care – PPO | Admitting: Family

## 2021-07-09 ENCOUNTER — Other Ambulatory Visit: Payer: Self-pay

## 2021-07-09 ENCOUNTER — Encounter: Payer: Self-pay | Admitting: Family

## 2021-07-09 ENCOUNTER — Ambulatory Visit (INDEPENDENT_AMBULATORY_CARE_PROVIDER_SITE_OTHER): Payer: BC Managed Care – PPO | Admitting: Family

## 2021-07-09 VITALS — BP 136/87 | HR 81 | Temp 97.5°F | Ht 72.0 in | Wt 271.8 lb

## 2021-07-09 DIAGNOSIS — I10 Essential (primary) hypertension: Secondary | ICD-10-CM

## 2021-07-09 DIAGNOSIS — E785 Hyperlipidemia, unspecified: Secondary | ICD-10-CM | POA: Diagnosis not present

## 2021-07-09 DIAGNOSIS — K219 Gastro-esophageal reflux disease without esophagitis: Secondary | ICD-10-CM

## 2021-07-09 DIAGNOSIS — E669 Obesity, unspecified: Secondary | ICD-10-CM | POA: Diagnosis not present

## 2021-07-09 DIAGNOSIS — K760 Fatty (change of) liver, not elsewhere classified: Secondary | ICD-10-CM

## 2021-07-09 NOTE — Patient Instructions (Addendum)
Fatty Liver Disease  The liver converts food into energy, removes toxic material from the blood, makes important proteins, and absorbs necessary vitamins from food. Fatty liver disease occurs when too much fat has built up in your liver cells. Fatty liverdisease is also called hepatic steatosis. In many cases, fatty liver disease does not cause symptoms or problems. It is often diagnosed when tests are being done for other reasons. However, over time, fatty liver can cause inflammation that may lead to more serious liver problems, such as scarring of the liver (cirrhosis) and liver failure. Fatty liver is associated with insulin resistance, increased body fat, high blood pressure (hypertension), and high cholesterol. These are features of metabolic syndrome and increaseyour risk for stroke, diabetes, and heart disease. What are the causes? This condition may be caused by components of metabolic syndrome: Obesity. Insulin resistance. High cholesterol. Other causes: Alcohol abuse. Poor nutrition. Cushing syndrome. Pregnancy. Certain drugs. Poisons. Some viral infections. What increases the risk? You are more likely to develop this condition if you: Abuse alcohol. Are overweight. Have diabetes. Have hepatitis. Have a high triglyceride level. Are pregnant. What are the signs or symptoms? Fatty liver disease often does not cause symptoms. If symptoms do develop, they can include: Fatigue and weakness. Weight loss. Confusion. Nausea, vomiting, or abdominal pain. Yellowing of your skin and the white parts of your eyes (jaundice). Itchy skin. How is this diagnosed? This condition may be diagnosed by: A physical exam and your medical history. Blood tests. Imaging tests, such as an ultrasound, CT scan, or MRI. A liver biopsy. A small sample of liver tissue is removed using a needle. The sample is then looked at under a microscope. How is this treated? Fatty liver disease is often  caused by other health conditions. Treatment for fatty liver may involve medicines and lifestyle changes to manage conditions such as: Alcoholism. High cholesterol. Diabetes. Being overweight or obese. Follow these instructions at home:  Do not drink alcohol. If you have trouble quitting, ask your health care provider how to safely quit with the help of medicine or a supervised program. This is important to keep your condition from getting worse. Eat a healthy diet as told by your health care provider. Ask your health care provider about working with a dietitian to develop an eating plan. Exercise regularly. This can help you lose weight and control your cholesterol and diabetes. Talk to your health care provider about an exercise plan and which activities are best for you. Take over-the-counter and prescription medicines only as told by your health care provider. Keep all follow-up visits. This is important. Contact a health care provider if: You have trouble controlling your: Blood sugar. This is especially important if you have diabetes. Cholesterol. Drinking of alcohol. Get help right away if: You have abdominal pain. You have jaundice. You have nausea and are vomiting. You vomit blood or material that looks like coffee grounds. You have stools that are black, tar-like, or bloody. Summary Fatty liver disease develops when too much fat builds up in the cells of your liver. Fatty liver disease often causes no symptoms or problems. However, over time, fatty liver can cause inflammation that may lead to more serious liver problems, such as scarring of the liver (cirrhosis). You are more likely to develop this condition if you abuse alcohol, are pregnant, are overweight, have diabetes, have hepatitis, or have high triglyceride or cholesterol levels. Contact your health care provider if you have trouble controlling your blood sugar, cholesterol,   or drinking of alcohol. This information is  not intended to replace advice given to you by your health care provider. Make sure you discuss any questions you have with your healthcare provider. Document Revised: 06/08/2020 Document Reviewed: 06/08/2020 Elsevier Patient Education  2022 Elsevier Inc.  

## 2021-07-09 NOTE — Progress Notes (Signed)
Subjective:    Patient ID: Adrian Chaney, male    DOB: [birth year], 60 y.o.   MRN: [id number]  Chief Complaint  Patient presents with   Medical Management of Chronic Issues    Pt presents to the office today for chronic follow up. Pt is morbid obese with BMI 36.8 with co morbidity of HTN and GERD.  He has fatty liver. He tries to be on low fat diet 3-4 times a week.  Hypertension This is a chronic problem. The current episode started more than 1 year ago. The problem has been resolved since onset. The problem is controlled. Pertinent negatives include no malaise/fatigue, peripheral edema or shortness of breath. Risk factors for coronary artery disease include dyslipidemia, obesity and male gender. The current treatment provides moderate improvement.  Gastroesophageal Reflux He complains of belching and heartburn. This is a chronic problem. The problem occurs occasionally. The problem has been waxing and waning. He has tried a PPI for the symptoms.  Hyperlipidemia This is a chronic problem. The current episode started more than 1 year ago. The problem is controlled. Exacerbating diseases include obesity. Factors aggravating his hyperlipidemia include fatty foods. Pertinent negatives include no shortness of breath. The current treatment provides moderate improvement of lipids. Risk factors for coronary artery disease include dyslipidemia, hypertension and male sex.     Review of Systems  Constitutional:  Negative for malaise/fatigue.  Respiratory:  Negative for shortness of breath.   Gastrointestinal:  Positive for heartburn.  All other systems reviewed and are negative.     Objective:   Physical Exam Vitals reviewed.  Constitutional:      General: He is not in acute distress.    Appearance: He is well-developed. He is obese.  HENT:     Head: Normocephalic.     Right Ear: Tympanic membrane normal.     Left Ear: Tympanic membrane normal.  Eyes:     General:        Right eye: No  discharge.        Left eye: No discharge.     Pupils: Pupils are equal, round, and reactive to light.  Neck:     Thyroid: No thyromegaly.  Cardiovascular:     Rate and Rhythm: Normal rate and regular rhythm.     Heart sounds: Normal heart sounds. No murmur heard. Pulmonary:     Effort: Pulmonary effort is normal. No respiratory distress.     Breath sounds: Normal breath sounds. No wheezing.  Abdominal:     General: Bowel sounds are normal. There is no distension.     Palpations: Abdomen is soft.     Tenderness: There is no abdominal tenderness.  Musculoskeletal:        General: No tenderness. Normal range of motion.     Cervical back: Normal range of motion and neck supple.  Skin:    General: Skin is warm and dry.     Findings: No erythema or rash.  Neurological:     Mental Status: He is alert and oriented to person, place, and time.     Cranial Nerves: No cranial nerve deficit.     Deep Tendon Reflexes: Reflexes are normal and symmetric.  Psychiatric:        Behavior: Behavior normal.        Thought Content: Thought content normal.        Judgment: Judgment normal.       BP 136/87   Pulse 81   Temp (!) 97.5 F (  36.4 C)   Ht 6' (1.829 m)   Wt 271 lb 12.8 oz (123.3 kg)   SpO2 95%   BMI 36.86 kg/m   Assessment & Plan:  Adrian Chaney comes in today with chief complaint of Medical Management of Chronic Issues   Diagnosis and orders addressed:  1. Essential hypertension  - BMP8+EGFR - Hepatic function panel  2. Gastroesophageal reflux disease, unspecified whether esophagitis present - BMP8+EGFR - Hepatic function panel  3. Obesity (BMI 30-39.9) - BMP8+EGFR - Hepatic function panel  4. Hyperlipidemia, unspecified hyperlipidemia type - BMP8+EGFR - Hepatic function panel  5. Fatty liver - Hepatic function panel   Labs pending Health Maintenance reviewed Diet and exercise encouraged  Follow up plan: 6 months    Evelina Dun, FNP

## 2021-07-10 LAB — BMP8+EGFR
BUN/Creatinine Ratio: 16 (ref 9–20)
BUN: 15 mg/dL (ref 6–24)
CO2: 23 mmol/L (ref 20–29)
Calcium: 9.6 mg/dL (ref 8.7–10.2)
Chloride: 100 mmol/L (ref 96–106)
Creatinine, Ser: 0.91 mg/dL (ref 0.76–1.27)
Glucose: 101 mg/dL — ABNORMAL HIGH (ref 70–99)
Potassium: 4.2 mmol/L (ref 3.5–5.2)
Sodium: 137 mmol/L (ref 134–144)
eGFR: 97 mL/min/{1.73_m2} (ref >59)

## 2021-07-10 LAB — HEPATIC FUNCTION PANEL
ALT: 78 IU/L — ABNORMAL HIGH (ref 0–44)
AST: 42 IU/L — ABNORMAL HIGH (ref 0–40)
Albumin: 4.9 g/dL (ref 3.8–4.9)
Alkaline Phosphatase: 53 IU/L (ref 44–121)
Bilirubin Total: 0.3 mg/dL (ref 0.0–1.2)
Bilirubin, Direct: <0.1 mg/dL (ref 0.00–0.40)
Total Protein: 6.9 g/dL (ref 6.0–8.5)

## 2021-07-11 ENCOUNTER — Telehealth: Payer: Self-pay | Admitting: Family

## 2021-07-11 DIAGNOSIS — I10 Essential (primary) hypertension: Secondary | ICD-10-CM

## 2021-07-11 MED ORDER — LISINOPRIL 40 MG PO TABS: 40.0000 mg | ORAL_TABLET | Freq: Every day | ORAL | 1 refills | Status: DC

## 2021-07-11 NOTE — Telephone Encounter (Signed)
  Prescription Request  07/11/2021  Is this a "Controlled Substance" medicine? NO  Have you seen your PCP in the last 2 weeks? NO  If YES, route message to pool  -  If NO, patient needs to be scheduled for appointment.  What is the name of the medication or equipment? Lisinopril 40 mg #90  Have you contacted your pharmacy to request a refill? yes   Which pharmacy would you like this sent to? [facility]   Patient notified that their request is being sent to the clinical staff for review and that they should receive a response within 2 business days.

## 2021-07-11 NOTE — Telephone Encounter (Signed)
Pt aware refill sent to pharmacy 

## 2021-07-13 ENCOUNTER — Other Ambulatory Visit: Payer: Self-pay | Admitting: Family

## 2021-07-13 DIAGNOSIS — I10 Essential (primary) hypertension: Secondary | ICD-10-CM

## 2021-09-15 IMAGING — US US ABDOMEN LIMITED
1 series · 14 of 25 positions shown · non-contrast
Comparison: None.

CLINICAL DATA: Elevated LFTs

EXAM:
ULTRASOUND ABDOMEN LIMITED RIGHT UPPER QUADRANT

[Series 1: us abdomen limited ruq · 14 of 64 slices shown]
[im 1/64]
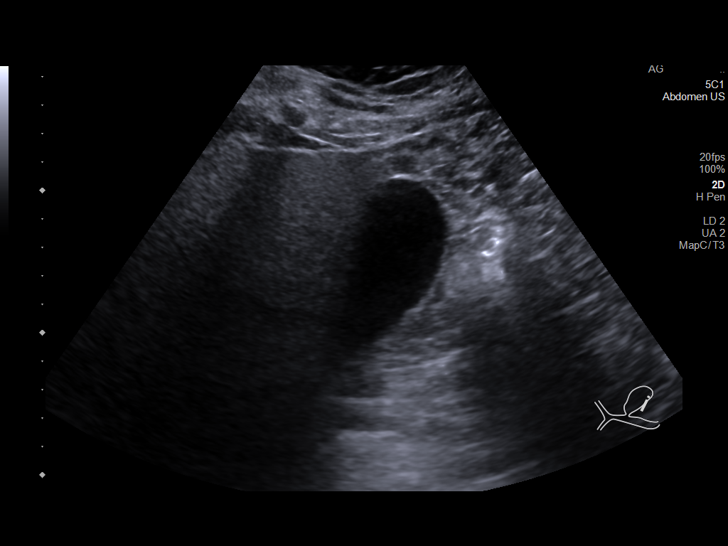
[im 6/64]
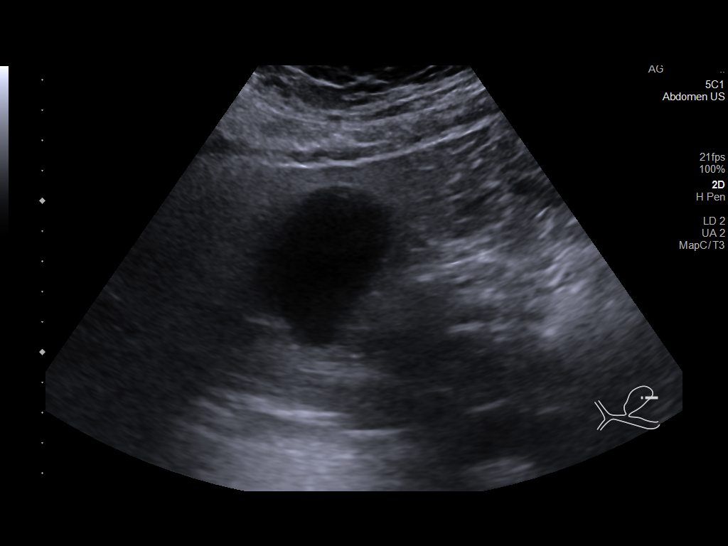
[im 11/64]
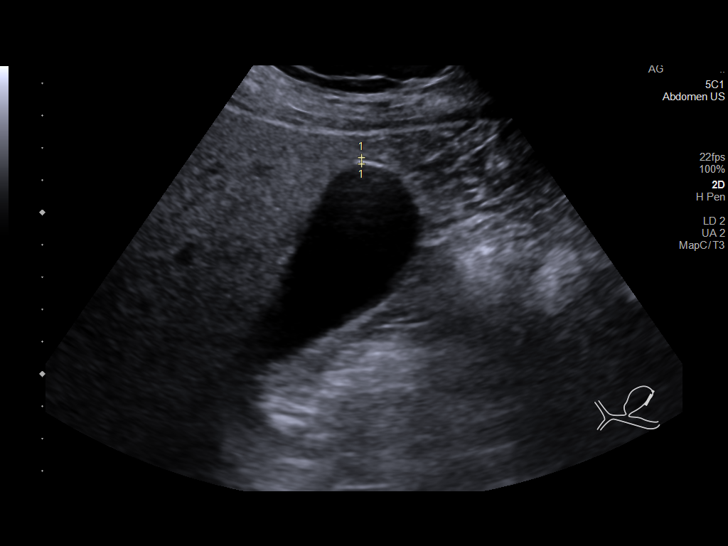
[im 16/64]
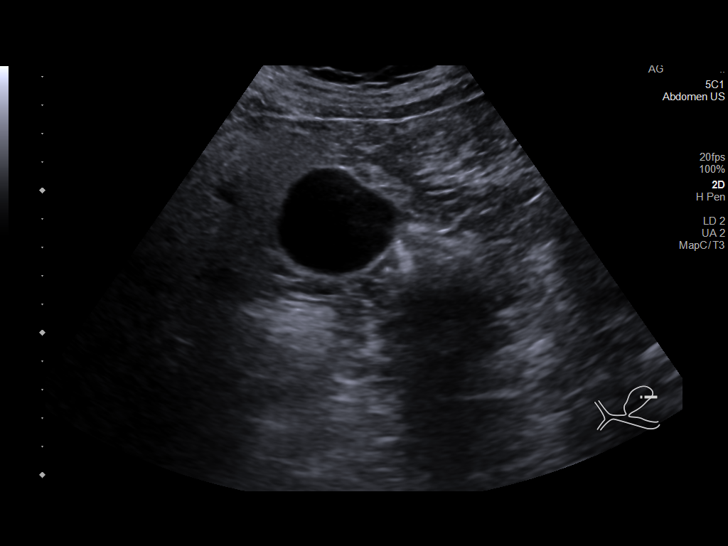
[im 22/64]
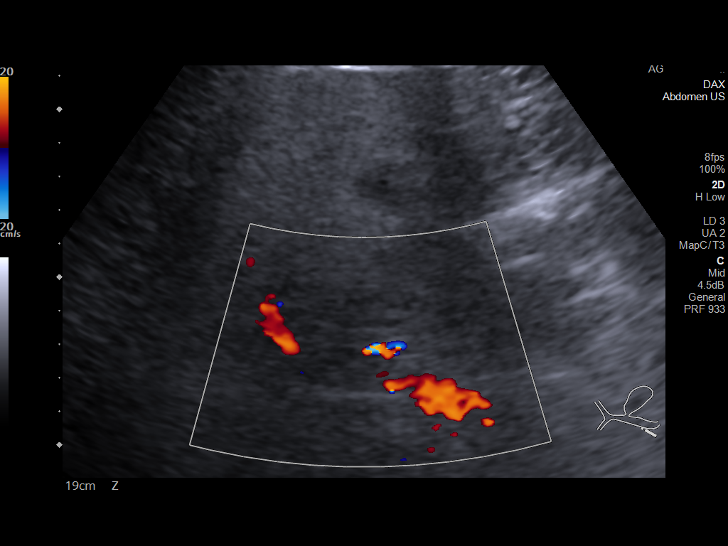
[im 24/64]
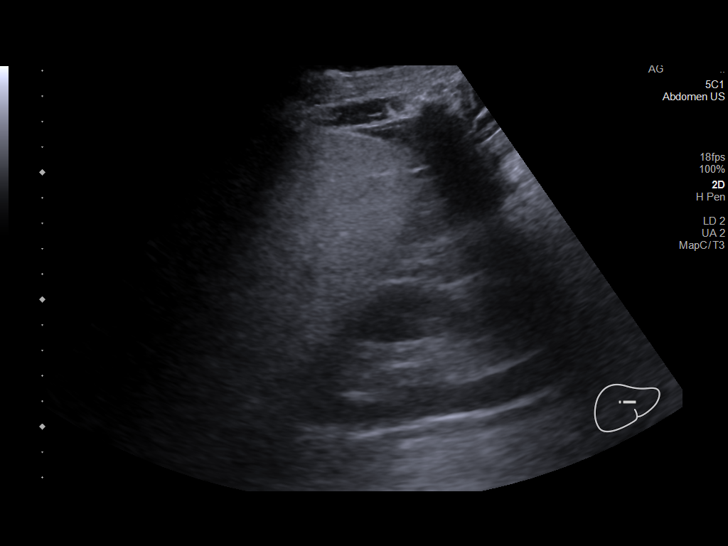
[im 29/64]
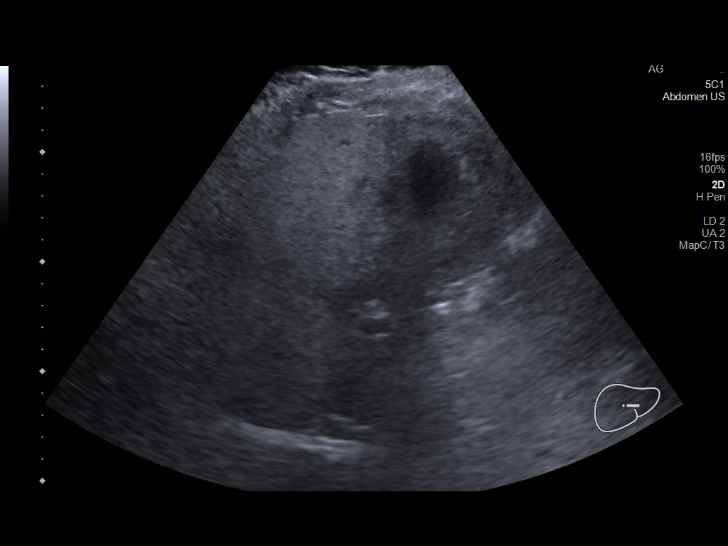
[im 35/64]
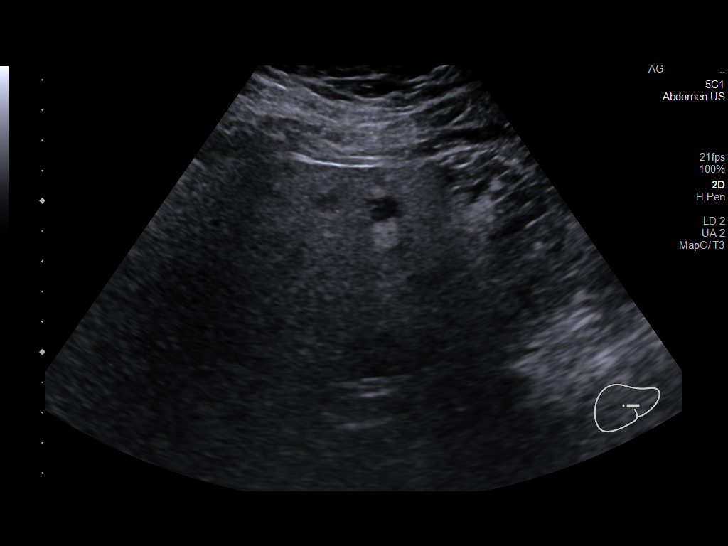
[im 40/64]
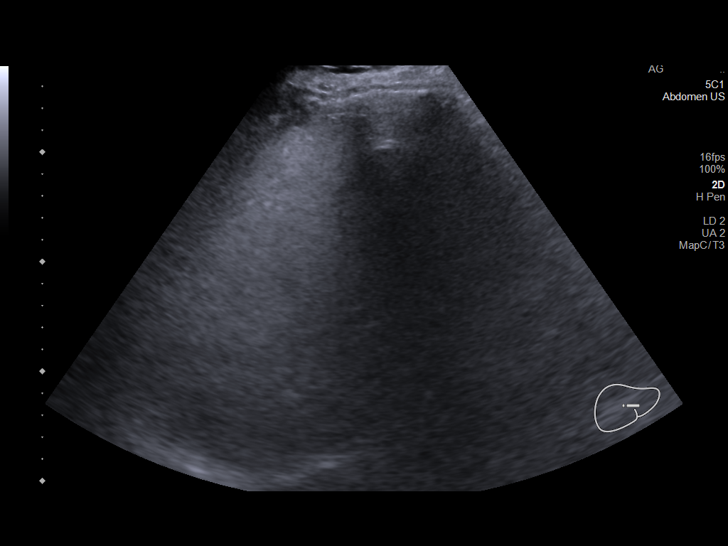
[im 43/64]
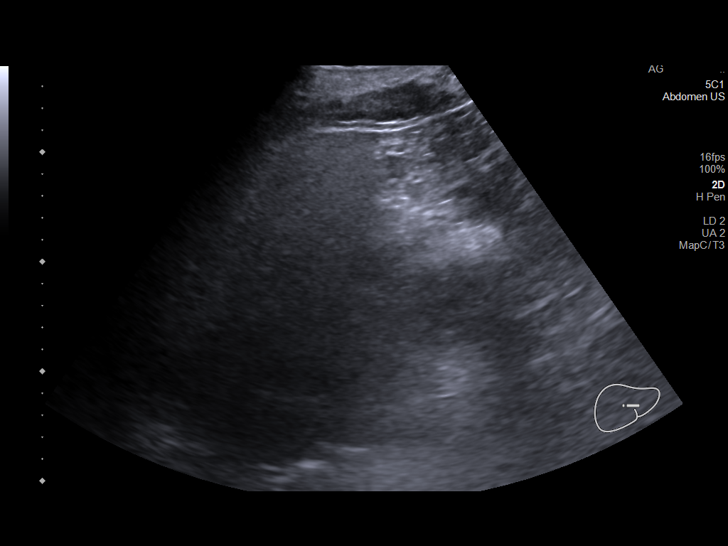
[im 48/64]
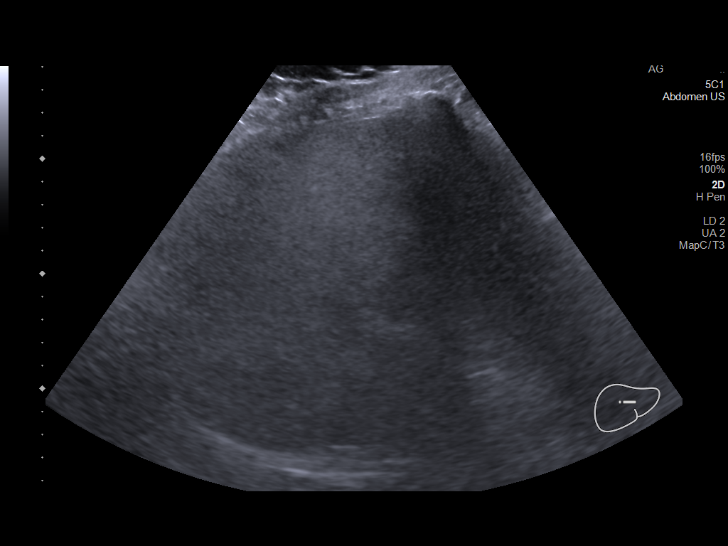
[im 53/64]
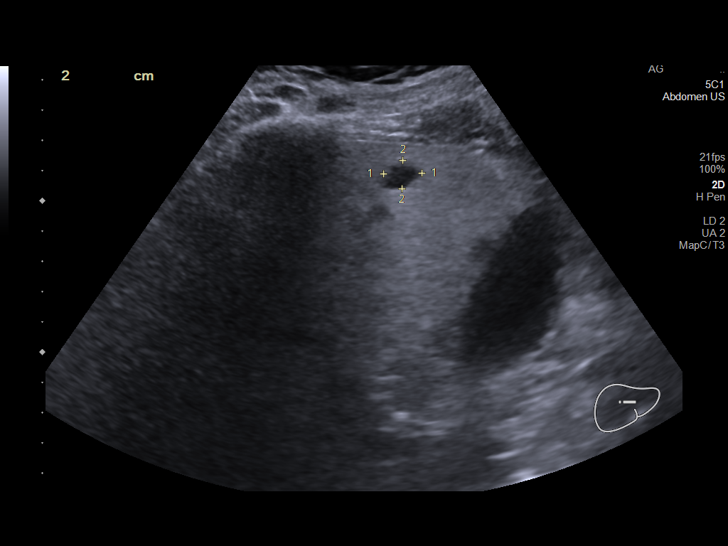
[im 58/64]
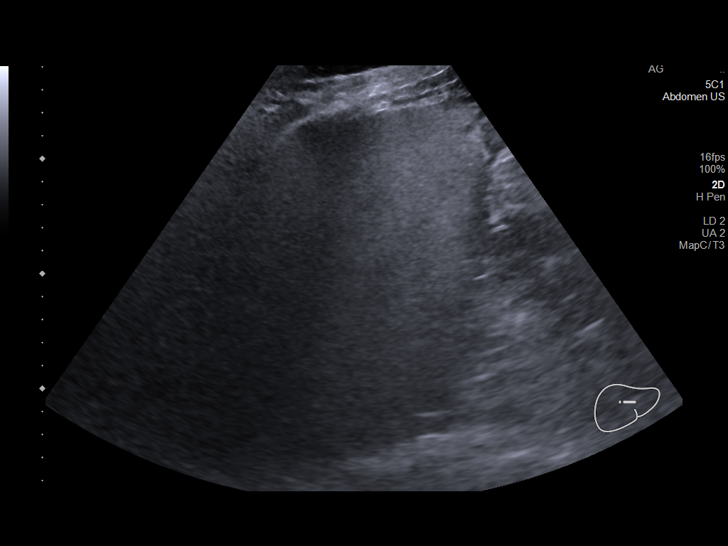
[im 64/64]
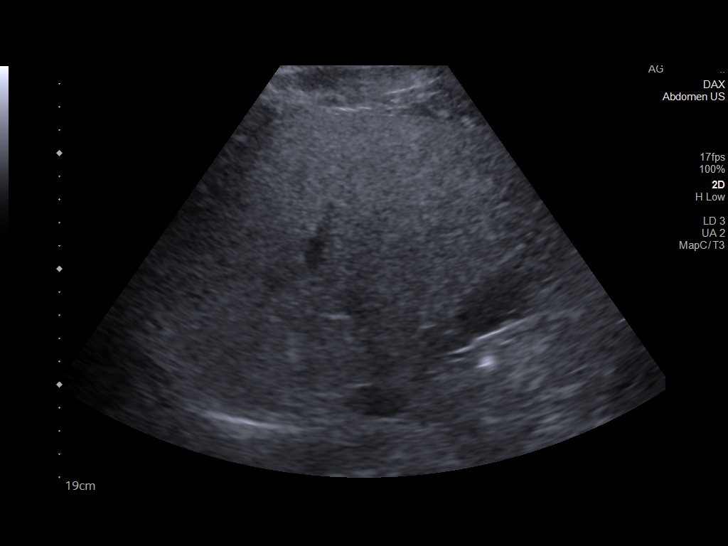

[14 of 25 positions shown; findings below may reference images not displayed]

FINDINGS: Gallbladder:

No gallstones or wall thickening visualized. No sonographic Murphy
sign noted by sonographer.

Common bile duct:

Diameter: 2.3 mm.

Liver:

Diffuse increased echogenicity is noted consistent with fatty
infiltration. A small 1.3 cm cyst is noted within the right lobe of
the liver anteriorly. Portal vein is patent on color Doppler imaging
with normal direction of blood flow towards the liver.

Other: None.
IMPRESSION: No acute abnormality noted.

Fatty infiltration of the liver.

Small right hepatic cyst is seen.

## 2021-11-04 ENCOUNTER — Other Ambulatory Visit: Payer: Self-pay | Admitting: Family

## 2021-11-04 DIAGNOSIS — E785 Hyperlipidemia, unspecified: Secondary | ICD-10-CM

## 2022-01-08 ENCOUNTER — Ambulatory Visit (INDEPENDENT_AMBULATORY_CARE_PROVIDER_SITE_OTHER): Payer: BC Managed Care – PPO | Admitting: Family

## 2022-01-08 ENCOUNTER — Encounter: Payer: Self-pay | Admitting: Family

## 2022-01-08 VITALS — BP 133/73 | HR 72 | Temp 97.5°F | Resp 16 | Ht 72.0 in | Wt 282.2 lb

## 2022-01-08 DIAGNOSIS — Z0001 Encounter for general adult medical examination with abnormal findings: Secondary | ICD-10-CM

## 2022-01-08 DIAGNOSIS — E785 Hyperlipidemia, unspecified: Secondary | ICD-10-CM

## 2022-01-08 DIAGNOSIS — Z0000 Encounter for general adult medical examination without abnormal findings: Secondary | ICD-10-CM | POA: Diagnosis not present

## 2022-01-08 DIAGNOSIS — R739 Hyperglycemia, unspecified: Secondary | ICD-10-CM | POA: Diagnosis not present

## 2022-01-08 DIAGNOSIS — K760 Fatty (change of) liver, not elsewhere classified: Secondary | ICD-10-CM

## 2022-01-08 DIAGNOSIS — E669 Obesity, unspecified: Secondary | ICD-10-CM

## 2022-01-08 DIAGNOSIS — I10 Essential (primary) hypertension: Secondary | ICD-10-CM

## 2022-01-08 DIAGNOSIS — K219 Gastro-esophageal reflux disease without esophagitis: Secondary | ICD-10-CM | POA: Diagnosis not present

## 2022-01-08 NOTE — Patient Instructions (Signed)
Health Maintenance, Male Adopting a healthy lifestyle and getting preventive care are important in promoting health and wellness. Ask your health care provider about: The right schedule for you to have regular tests and exams. Things you can do on your own to prevent diseases and keep yourself healthy. What should I know about diet, weight, and exercise? Eat a healthy diet  Eat a diet that includes plenty of vegetables, fruits, low-fat dairy products, and lean protein. Do not eat a lot of foods that are high in solid fats, added sugars, or sodium. Maintain a healthy weight Body mass index (BMI) is a measurement that can be used to identify possible weight problems. It estimates body fat based on height and weight. Your health care provider can help determine your BMI and help you achieve or maintain a healthy weight. Get regular exercise Get regular exercise. This is one of the most important things you can do for your health. Most adults should: Exercise for at least 150 minutes each week. The exercise should increase your heart rate and make you sweat (moderate-intensity exercise). Do strengthening exercises at least twice a week. This is in addition to the moderate-intensity exercise. Spend less time sitting. Even light physical activity can be beneficial. Watch cholesterol and blood lipids Have your blood tested for lipids and cholesterol at 60 years of age, then have this test every 5 years. You may need to have your cholesterol levels checked more often if: Your lipid or cholesterol levels are high. You are older than 60 years of age. You are at high risk for heart disease. What should I know about cancer screening? Many types of cancers can be detected early and may often be prevented. Depending on your health history and family history, you may need to have cancer screening at various ages. This may include screening for: Colorectal cancer. Prostate cancer. Skin cancer. Lung  cancer. What should I know about heart disease, diabetes, and high blood pressure? Blood pressure and heart disease High blood pressure causes heart disease and increases the risk of stroke. This is more likely to develop in people who have high blood pressure readings or are overweight. Talk with your health care provider about your target blood pressure readings. Have your blood pressure checked: Every 3-5 years if you are 18-39 years of age. Every year if you are 40 years old or older. If you are between the ages of 65 and 75 and are a current or former smoker, ask your health care provider if you should have a one-time screening for abdominal aortic aneurysm (AAA). Diabetes Have regular diabetes screenings. This checks your fasting blood sugar level. Have the screening done: Once every three years after age 45 if you are at a normal weight and have a low risk for diabetes. More often and at a younger age if you are overweight or have a high risk for diabetes. What should I know about preventing infection? Hepatitis B If you have a higher risk for hepatitis B, you should be screened for this virus. Talk with your health care provider to find out if you are at risk for hepatitis B infection. Hepatitis C Blood testing is recommended for: Everyone born from 1945 through 1965. Anyone with known risk factors for hepatitis C. Sexually transmitted infections (STIs) You should be screened each year for STIs, including gonorrhea and chlamydia, if: You are sexually active and are younger than 60 years of age. You are older than 60 years of age and your   health care provider tells you that you are at risk for this type of infection. Your sexual activity has changed since you were last screened, and you are at increased risk for chlamydia or gonorrhea. Ask your health care provider if you are at risk. Ask your health care provider about whether you are at high risk for HIV. Your health care provider  may recommend a prescription medicine to help prevent HIV infection. If you choose to take medicine to prevent HIV, you should first get tested for HIV. You should then be tested every 3 months for as long as you are taking the medicine. Follow these instructions at home: Alcohol use Do not drink alcohol if your health care provider tells you not to drink. If you drink alcohol: Limit how much you have to 0-2 drinks a day. Know how much alcohol is in your drink. In the [place] one drink equals one 12 oz bottle of beer (355 mL), one 5 oz glass of wine (148 mL), or one 1 oz glass of hard liquor (44 mL). Lifestyle Do not use any products that contain nicotine or tobacco. These products include cigarettes, chewing tobacco, and vaping devices, such as e-cigarettes. If you need help quitting, ask your health care provider. Do not use street drugs. Do not share needles. Ask your health care provider for help if you need support or information about quitting drugs. General instructions Schedule regular health, dental, and eye exams. Stay current with your vaccines. Tell your health care provider if: You often feel depressed. You have ever been abused or do not feel safe at home. Summary Adopting a healthy lifestyle and getting preventive care are important in promoting health and wellness. Follow your health care provider's instructions about healthy diet, exercising, and getting tested or screened for diseases. Follow your health care provider's instructions on monitoring your cholesterol and blood pressure. This information is not intended to replace advice given to you by your health care provider. Make sure you discuss any questions you have with your health care provider. Document Revised: 01/15/2021 Document Reviewed: 01/15/2021 Elsevier Patient Education  2023 Elsevier Inc.  

## 2022-01-08 NOTE — Progress Notes (Signed)
? ?Subjective:  ? ? Patient ID: Adrian Chaney, male    DOB: 10/09/1960, 61 y.o.   MRN: [id number] ? ?Chief Complaint  ?Patient presents with  ? Medical Management of Chronic Issues  ? ?Pt presents to the office today for CPE and  chronic follow up. Pt is morbid obese with BMI 38 with co morbidity of HTN and GERD.  He has fatty liver. He tries to eat a low fat diet at times.  ?Hypertension ?This is a chronic problem. The current episode started more than 1 year ago. The problem has been resolved since onset. The problem is controlled. Pertinent negatives include no malaise/fatigue, peripheral edema or shortness of breath. Risk factors for coronary artery disease include dyslipidemia, obesity and male gender. The current treatment provides moderate improvement.  ?Gastroesophageal Reflux ?He complains of belching and heartburn. This is a chronic problem. The current episode started more than 1 year ago. The problem occurs occasionally. The symptoms are aggravated by medications. He has tried a PPI for the symptoms. The treatment provided moderate relief.  ?Hyperlipidemia ?This is a chronic problem. The current episode started more than 1 year ago. The problem is controlled. Recent lipid tests were reviewed and are normal. Exacerbating diseases include obesity. Pertinent negatives include no shortness of breath. Current antihyperlipidemic treatment includes statins. The current treatment provides moderate improvement of lipids. Risk factors for coronary artery disease include dyslipidemia, male sex, hypertension and a sedentary lifestyle.  ? ? ? ?Review of Systems  ?Constitutional:  Negative for malaise/fatigue.  ?Respiratory:  Negative for shortness of breath.   ?Gastrointestinal:  Positive for heartburn.  ?All other systems reviewed and are negative. ? ?Family History  ?Problem Relation Age of Onset  ? Hypertension Mother   ? Diabetes Father   ? Cancer Father   ?     colon  ? Colon cancer Father   ? Scoliosis Brother    ? Esophageal cancer Neg Hx   ? Rectal cancer Neg Hx   ? Stomach cancer Neg Hx   ? ?Social History  ? ?Socioeconomic History  ? Marital status: Single  ?  Spouse name: Not on file  ? Number of children: Not on file  ? Years of education: Not on file  ? Highest education level: Not on file  ?Occupational History  ? Not on file  ?Tobacco Use  ? Smoking status: Former  ? Smokeless tobacco: Never  ?Vaping Use  ? Vaping Use: Never used  ?Substance and Sexual Activity  ? Alcohol use: Not Currently  ?  Alcohol/week: 1.0 - 2.0 standard drink  ?  Types: 1 - 2 Cans of beer per week  ? Drug use: Yes  ?  Frequency: 1.0 times per week  ?  Types: Marijuana  ?  Comment: 1 joint a week.   ? Sexual activity: Not on file  ?Other Topics Concern  ? Not on file  ?Social History Narrative  ? Not on file  ? ?Social Determinants of Health  ? ?Financial Resource Strain: Not on file  ?Food Insecurity: Not on file  ?Transportation Needs: Not on file  ?Physical Activity: Not on file  ?Stress: Not on file  ?Social Connections: Not on file  ? ? ?   ?Objective:  ? Physical Exam ?Vitals reviewed.  ?Constitutional:   ?   General: He is not in acute distress. ?   Appearance: He is well-developed. He is obese.  ?HENT:  ?   Head: Normocephalic.  ?     Right Ear: Tympanic membrane normal.  ?   Left Ear: Tympanic membrane normal.  ?Eyes:  ?   General:     ?   Right eye: No discharge.     ?   Left eye: No discharge.  ?   Pupils: Pupils are equal, round, and reactive to light.  ?Neck:  ?   Thyroid: No thyromegaly.  ?Cardiovascular:  ?   Rate and Rhythm: Normal rate and regular rhythm.  ?   Heart sounds: Normal heart sounds. No murmur heard. ?Pulmonary:  ?   Effort: Pulmonary effort is normal. No respiratory distress.  ?   Breath sounds: Normal breath sounds. No wheezing.  ?Abdominal:  ?   General: Bowel sounds are normal. There is no distension.  ?   Palpations: Abdomen is soft.  ?   Tenderness: There is no abdominal tenderness.  ?Musculoskeletal:     ?    General: No tenderness. Normal range of motion.  ?   Cervical back: Normal range of motion and neck supple.  ?Skin: ?   General: Skin is warm and dry.  ?   Findings: No erythema or rash.  ?Neurological:  ?   Mental Status: He is alert and oriented to person, place, and time.  ?   Cranial Nerves: No cranial nerve deficit.  ?   Deep Tendon Reflexes: Reflexes are normal and symmetric.  ?Psychiatric:     ?   Behavior: Behavior normal.     ?   Thought Content: Thought content normal.     ?   Judgment: Judgment normal.  ? ? ? ?BP 133/73   Pulse 72   Temp (!) 97.5 ?F (36.4 ?C)   Resp 16   Ht 6' (1.829 m)   Wt 282 lb 3.2 oz (128 kg)   SpO2 95%   BMI 38.27 kg/m?  ? ? ?   ?Assessment & Plan:  ?Adrian Chaney comes in today with chief complaint of Medical Management of Chronic Issues ? ? ?Diagnosis and orders addressed: ? ?1. Essential hypertension ?- CMP14+EGFR ?- CBC with Differential/Platelet ? ?2. Gastroesophageal reflux disease, unspecified whether esophagitis present ?- CMP14+EGFR ?- CBC with Differential/Platelet ? ?3. Fatty liver ?- CMP14+EGFR ?- CBC with Differential/Platelet ? ?4. Obesity (BMI 30-39.9) ?- CMP14+EGFR ?- CBC with Differential/Platelet ? ?5. Hyperlipidemia, unspecified hyperlipidemia type ?- CMP14+EGFR ?- CBC with Differential/Platelet ? ?6. Annual physical exam ?- CMP14+EGFR ?- CBC with Differential/Platelet ?- Lipid panel ?- PSA, total and free ?- TSH ? ? ?Labs pending ?Health Maintenance reviewed ?Diet and exercise encouraged ? ?Follow up plan: ?6 months  ? ? ?Christy Hawks, FNP ? ? ? ?

## 2022-01-09 LAB — CBC WITH DIFFERENTIAL/PLATELET
Basophils Absolute: 0.1 10*3/uL (ref 0.0–0.2)
Basos: 1 %
EOS (ABSOLUTE): 0.2 10*3/uL (ref 0.0–0.4)
Eos: 3 %
Hematocrit: 42.2 % (ref 37.5–51.0)
Hemoglobin: 14.8 g/dL (ref 13.0–17.7)
Immature Grans (Abs): 0 10*3/uL (ref 0.0–0.1)
Immature Granulocytes: 0 %
Lymphocytes Absolute: 2 10*3/uL (ref 0.7–3.1)
Lymphs: 28 %
MCH: 32.3 pg (ref 26.6–33.0)
MCHC: 35.1 g/dL (ref 31.5–35.7)
MCV: 92 fL (ref 79–97)
Monocytes Absolute: 0.6 10*3/uL (ref 0.1–0.9)
Monocytes: 8 %
Neutrophils Absolute: 4.3 10*3/uL (ref 1.4–7.0)
Neutrophils: 60 %
Platelets: 188 10*3/uL (ref 150–450)
RBC: 4.58 x10E6/uL (ref 4.14–5.80)
RDW: 12.2 % (ref 11.6–15.4)
WBC: 7.2 10*3/uL (ref 3.4–10.8)

## 2022-01-09 LAB — CMP14+EGFR
ALT: 80 IU/L — ABNORMAL HIGH (ref 0–44)
AST: 53 IU/L — ABNORMAL HIGH (ref 0–40)
Albumin/Globulin Ratio: 1.6 (ref 1.2–2.2)
Albumin: 4.2 g/dL (ref 3.8–4.9)
Alkaline Phosphatase: 57 IU/L (ref 44–121)
BUN/Creatinine Ratio: 18 (ref 9–20)
BUN: 18 mg/dL (ref 6–24)
Bilirubin Total: 0.3 mg/dL (ref 0.0–1.2)
CO2: 24 mmol/L (ref 20–29)
Calcium: 9.2 mg/dL (ref 8.7–10.2)
Chloride: 101 mmol/L (ref 96–106)
Creatinine, Ser: 1.01 mg/dL (ref 0.76–1.27)
Globulin, Total: 2.6 g/dL (ref 1.5–4.5)
Glucose: 160 mg/dL — ABNORMAL HIGH (ref 70–99)
Potassium: 4 mmol/L (ref 3.5–5.2)
Sodium: 140 mmol/L (ref 134–144)
Total Protein: 6.8 g/dL (ref 6.0–8.5)
eGFR: 86 mL/min/{1.73_m2} (ref >59)

## 2022-01-09 LAB — LIPID PANEL
Chol/HDL Ratio: 6 ratio — ABNORMAL HIGH (ref 0.0–5.0)
Cholesterol, Total: 173 mg/dL (ref 100–199)
HDL: 29 mg/dL — ABNORMAL LOW (ref >39)
LDL Chol Calc (NIH): 102 mg/dL — ABNORMAL HIGH (ref 0–99)
Triglycerides: 242 mg/dL — ABNORMAL HIGH (ref 0–149)
VLDL Cholesterol Cal: 42 mg/dL — ABNORMAL HIGH (ref 5–40)

## 2022-01-09 LAB — PSA, TOTAL AND FREE
PSA, Free Pct: 32.1 %
PSA, Free: 0.61 ng/mL
Prostate Specific Ag, Serum: 1.9 ng/mL (ref 0.0–4.0)

## 2022-01-09 LAB — TSH: TSH: 1.67 u[IU]/mL (ref 0.450–4.500)

## 2022-01-10 LAB — HGB A1C W/O EAG: Hgb A1c MFr Bld: 6.4 % — ABNORMAL HIGH (ref 4.8–5.6)

## 2022-01-10 LAB — SPECIMEN STATUS REPORT

## 2022-01-11 ENCOUNTER — Other Ambulatory Visit: Payer: Self-pay | Admitting: Family

## 2022-01-11 DIAGNOSIS — I10 Essential (primary) hypertension: Secondary | ICD-10-CM

## 2022-01-11 DIAGNOSIS — E785 Hyperlipidemia, unspecified: Secondary | ICD-10-CM

## 2022-01-14 ENCOUNTER — Other Ambulatory Visit: Payer: Self-pay | Admitting: Family

## 2022-01-14 DIAGNOSIS — R7303 Prediabetes: Secondary | ICD-10-CM | POA: Insufficient documentation

## 2022-01-17 ENCOUNTER — Ambulatory Visit (INDEPENDENT_AMBULATORY_CARE_PROVIDER_SITE_OTHER): Payer: BC Managed Care – PPO | Admitting: Pharmacist

## 2022-01-17 DIAGNOSIS — R7303 Prediabetes: Secondary | ICD-10-CM | POA: Diagnosis not present

## 2022-01-17 NOTE — Progress Notes (Signed)
? ? ?  01/17/2022 ?Name: Adrian Chaney MRN: [id number] DOB: 08-Mar-1961 ? ? ?S:  15 yoM Presents for PRE-diabetes evaluation, education, and management.  Patient was referred and last seen by Primary Care Provider on 01/08/22 ?Patient reports pre-diabetes was diagnosed in 2023.  He reports his father had T2DM.  He is motivated to reverse his pre-diabetes. ? ?Insurance coverage/medication affordability: BC[employer] COMMERCIAL  ? ?Patient reports adherence with medications. ?Current diabetes medications include: N/A ?Current hypertension medications include: HCTZ, LISINOPRIL ?Goal 130/80 ?Current hyperlipidemia medications include: ROSUVASTATIN ?  ?  ?Patient reported dietary habits: Eats 3 meals/day ?-travels for work/on the go --> usually eats "junk food" ? ?Patient-reported exercise habits: ENCOURAGED ?  ? ?O: ? ?Lab Results  ?Component Value Date  ? HGBA1C 6.4 (H) 01/08/2022  ? ? ? ?Lipid Panel ? ?   ?Component Value Date/Time  ? CHOL 173 01/08/2022 1534  ? TRIG 242 (H) 01/08/2022 1534  ? HDL 29 (L) 01/08/2022 1534  ? CHOLHDL 6.0 (H) 01/08/2022 1534  ? LDLCALC 102 (H) 01/08/2022 1534  ? ? ? Home fasting blood sugars: not checking ? ?2 hour post-meal/random blood sugars: n/a. ?  ? ?Clinical Atherosclerotic Cardiovascular Disease (ASCVD): No  ? ?The 10-year ASCVD risk score (Arnett DK, et al., 2019) is: 12.8% ?  Values used to calculate the score: ?    Age: 61 years ?    Sex: Male ?    Is Non-Hispanic African American: No ?    Diabetic: No ?    Tobacco smoker: No ?    Systolic Blood Pressure: 726 mmHg ?    Is BP treated: Yes ?    HDL Cholesterol: 29 mg/dL ?    Total Cholesterol: 173 mg/dL ?  ? ?A/P: ? ?PRE-Diabetes, A1c 6.4 reviewed with patient.  He is very close to having a diagnosis of t2dm based on a1c range.   ? ?-T2DM dietary information mailed to patient ? ?-Discussed limiting carbs at each meal to 50 carbs  ? Avoid juices, sodas, sugary drinks ? Incorporate protein & fiber in to each meal ? ?-Increase water  intake ? ?-Increase exercise ? ?-Discussed potential weight loss medications (I.e Wegovy, saxenda), however current plan does not cover. ? ? ?-Extensively discussed pathophysiology of diabetes, recommended lifestyle interventions, dietary effects on blood sugar control ? ?-Counseled on s/sx of and management of hypoglycemia ? ?-Next A1C anticipated 6 months. ?  ? ?Written patient instructions provided.  Total time in counseling 20 minutes.  ? ?Follow up PCP Clinic Visit in 6 months.  ? ?Regina Eck, PharmD, BCPS ?Clinical Pharmacist, [facility] ?[place]  II Phone [phone] ? ? ?

## 2022-04-09 ENCOUNTER — Other Ambulatory Visit: Payer: Self-pay | Admitting: Family

## 2022-04-09 DIAGNOSIS — I10 Essential (primary) hypertension: Secondary | ICD-10-CM

## 2022-04-21 ENCOUNTER — Other Ambulatory Visit: Payer: Self-pay | Admitting: Family

## 2022-04-21 DIAGNOSIS — E785 Hyperlipidemia, unspecified: Secondary | ICD-10-CM

## 2022-07-04 ENCOUNTER — Encounter: Payer: Self-pay | Admitting: Internal Medicine

## 2022-07-11 ENCOUNTER — Ambulatory Visit: Payer: BC Managed Care – PPO | Admitting: Family

## 2022-07-17 ENCOUNTER — Other Ambulatory Visit: Payer: Self-pay | Admitting: Family

## 2022-07-17 DIAGNOSIS — I10 Essential (primary) hypertension: Secondary | ICD-10-CM

## 2022-08-09 ENCOUNTER — Ambulatory Visit: Payer: BC Managed Care – PPO | Admitting: Family

## 2022-08-23 ENCOUNTER — Ambulatory Visit (INDEPENDENT_AMBULATORY_CARE_PROVIDER_SITE_OTHER): Payer: PRIVATE HEALTH INSURANCE | Admitting: Family

## 2022-08-23 ENCOUNTER — Encounter: Payer: Self-pay | Admitting: Family

## 2022-08-23 VITALS — BP 127/72 | HR 79 | Temp 98.2°F | Ht 72.0 in | Wt 268.0 lb

## 2022-08-23 DIAGNOSIS — E785 Hyperlipidemia, unspecified: Secondary | ICD-10-CM | POA: Diagnosis not present

## 2022-08-23 DIAGNOSIS — K760 Fatty (change of) liver, not elsewhere classified: Secondary | ICD-10-CM

## 2022-08-23 DIAGNOSIS — I10 Essential (primary) hypertension: Secondary | ICD-10-CM | POA: Diagnosis not present

## 2022-08-23 DIAGNOSIS — K219 Gastro-esophageal reflux disease without esophagitis: Secondary | ICD-10-CM | POA: Diagnosis not present

## 2022-08-23 DIAGNOSIS — R7303 Prediabetes: Secondary | ICD-10-CM

## 2022-08-23 DIAGNOSIS — Z6836 Body mass index (BMI) 36.0-36.9, adult: Secondary | ICD-10-CM

## 2022-08-23 LAB — BAYER DCA HB A1C WAIVED: HB A1C (BAYER DCA - WAIVED): 6.1 % — ABNORMAL HIGH (ref 4.8–5.6)

## 2022-08-23 NOTE — Progress Notes (Signed)
Subjective:    Patient ID: Adrian Chaney, male    DOB: [birth year], 61 y.o.   MRN: [id number]  Chief Complaint  Patient presents with   Medical Management of Chronic Issues   Pt presents to the office today for chronic follow up. Pt is morbid obese with BMI 36 with co morbidity of HTN and GERD.  He has fatty liver. He tries to eat a low fat diet at times.  He has lost 14 lbs since our last visit.  Hypertension This is a chronic problem. The current episode started more than 1 year ago. The problem has been resolved since onset. The problem is controlled. Pertinent negatives include no blurred vision, malaise/fatigue, peripheral edema or shortness of breath. Risk factors for coronary artery disease include dyslipidemia, obesity and male gender. The current treatment provides moderate improvement.  Gastroesophageal Reflux He complains of belching and heartburn. This is a chronic problem. The current episode started more than 1 year ago. The problem occurs occasionally. Risk factors include obesity. He has tried a PPI for the symptoms. The treatment provided moderate relief.  Hyperlipidemia This is a chronic problem. The current episode started more than 1 year ago. The problem is controlled. Exacerbating diseases include obesity. Pertinent negatives include no shortness of breath. Current antihyperlipidemic treatment includes statins. The current treatment provides moderate improvement of lipids. Risk factors for coronary artery disease include dyslipidemia, diabetes mellitus, hypertension, a sedentary lifestyle and male sex.  Diabetes He presents for his follow-up diabetic visit. Diabetes type: prediabetes. Pertinent negatives for diabetes include no blurred vision and no foot paresthesias. Symptoms are stable. Risk factors for coronary artery disease include dyslipidemia, diabetes mellitus, male sex, hypertension and sedentary lifestyle. (Does not check at home)      Review of Systems   Constitutional:  Negative for malaise/fatigue.  Eyes:  Negative for blurred vision.  Respiratory:  Negative for shortness of breath.   Gastrointestinal:  Positive for heartburn.  All other systems reviewed and are negative.      Objective:   Physical Exam Vitals reviewed.  Constitutional:      General: He is not in acute distress.    Appearance: He is well-developed. He is obese.  HENT:     Head: Normocephalic.     Right Ear: Tympanic membrane normal.     Left Ear: Tympanic membrane normal.  Eyes:     General:        Right eye: No discharge.        Left eye: No discharge.     Pupils: Pupils are equal, round, and reactive to light.  Neck:     Thyroid: No thyromegaly.  Cardiovascular:     Rate and Rhythm: Normal rate and regular rhythm.     Heart sounds: Normal heart sounds. No murmur heard. Pulmonary:     Effort: Pulmonary effort is normal. No respiratory distress.     Breath sounds: Normal breath sounds. No wheezing.  Abdominal:     General: Bowel sounds are normal. There is no distension.     Palpations: Abdomen is soft.     Tenderness: There is no abdominal tenderness.  Musculoskeletal:        General: No tenderness. Normal range of motion.     Cervical back: Normal range of motion and neck supple.  Skin:    General: Skin is warm and dry.     Findings: No erythema or rash.  Neurological:     Mental Status: He is alert and  oriented to person, place, and time.     Cranial Nerves: No cranial nerve deficit.     Deep Tendon Reflexes: Reflexes are normal and symmetric.  Psychiatric:        Behavior: Behavior normal.        Thought Content: Thought content normal.        Judgment: Judgment normal.    BP 127/72   Pulse 79   Temp 98.2 F (36.8 C)   Ht 6' (1.829 m)   Wt 268 lb (121.6 kg)   SpO2 95%   BMI 36.35 kg/m      Assessment & Plan:  Adrian Chaney comes in today with chief complaint of Medical Management of Chronic Issues   Diagnosis and orders  addressed:  1. Essential hypertension - CMP14+EGFR - CBC with Differential/Platelet  2. Fatty liver - CMP14+EGFR - CBC with Differential/Platelet  3. Gastroesophageal reflux disease, unspecified whether esophagitis present - CMP14+EGFR - CBC with Differential/Platelet  4. Hyperlipidemia, unspecified hyperlipidemia type - CMP14+EGFR - CBC with Differential/Platelet  5. Morbid obesity ([place]) - CMP14+EGFR - CBC with Differential/Platelet  6. Prediabetes  - CMP14+EGFR - CBC with Differential/Platelet - Bayer DCA Hb A1c Waived   Labs pending Health Maintenance reviewed Diet and exercise encouraged  Follow up plan: 6 months    Evelina Dun, FNP

## 2022-08-23 NOTE — Patient Instructions (Signed)
Fatty Liver Disease  The liver converts food into energy, removes toxic material from the blood, makes important proteins, and absorbs necessary vitamins from food. Fatty liver disease occurs when too much fat has built up in your liver cells. Fatty liver disease is also called hepatic steatosis. In many cases, fatty liver disease does not cause symptoms or problems. It is often diagnosed when tests are being done for other reasons. However, over time, fatty liver can cause inflammation that may lead to more serious liver problems, such as scarring of the liver (cirrhosis) and liver failure. Fatty liver is associated with insulin resistance, increased body fat, high blood pressure (hypertension), and high cholesterol. These are features of metabolic syndrome and increase your risk for stroke, diabetes, and heart disease. What are the causes? This condition may be caused by components of metabolic syndrome: Obesity. Insulin resistance. High cholesterol. Other causes: Alcohol abuse. Poor nutrition. Cushing syndrome. Pregnancy. Certain drugs. Poisons. Some viral infections. What increases the risk? You are more likely to develop this condition if you: Abuse alcohol. Are overweight. Have diabetes. Have hepatitis. Have a high triglyceride level. Are pregnant. What are the signs or symptoms? Fatty liver disease often does not cause symptoms. If symptoms do develop, they can include: Fatigue and weakness. Weight loss. Confusion. Nausea, vomiting, or abdominal pain. Yellowing of your skin and the white parts of your eyes (jaundice). Itchy skin. How is this diagnosed? This condition may be diagnosed by: A physical exam and your medical history. Blood tests. Imaging tests, such as an ultrasound, CT scan, or MRI. A liver biopsy. A small sample of liver tissue is removed using a needle. The sample is then looked at under a microscope. How is this treated? Fatty liver disease is often  caused by other health conditions. Treatment for fatty liver may involve medicines and lifestyle changes to manage conditions such as: Alcoholism. High cholesterol. Diabetes. Being overweight or obese. Follow these instructions at home:  Do not drink alcohol. If you have trouble quitting, ask your health care provider how to safely quit with the help of medicine or a supervised program. This is important to keep your condition from getting worse. Eat a healthy diet as told by your health care provider. Ask your health care provider about working with a dietitian to develop an eating plan. Exercise regularly. This can help you lose weight and control your cholesterol and diabetes. Talk to your health care provider about an exercise plan and which activities are best for you. Take over-the-counter and prescription medicines only as told by your health care provider. Keep all follow-up visits. This is important. Contact a health care provider if: You have trouble controlling your: Blood sugar. This is especially important if you have diabetes. Cholesterol. Drinking of alcohol. Get help right away if: You have abdominal pain. You have jaundice. You have nausea and are vomiting. You vomit blood or material that looks like coffee grounds. You have stools that are black, tar-like, or bloody. Summary Fatty liver disease develops when too much fat builds up in the cells of your liver. Fatty liver disease often causes no symptoms or problems. However, over time, fatty liver can cause inflammation that may lead to more serious liver problems, such as scarring of the liver (cirrhosis). You are more likely to develop this condition if you abuse alcohol, are pregnant, are overweight, have diabetes, have hepatitis, or have high triglyceride or cholesterol levels. Contact your health care provider if you have trouble controlling your blood   sugar, cholesterol, or drinking of alcohol. This information is  not intended to replace advice given to you by your health care provider. Make sure you discuss any questions you have with your health care provider. Document Revised: 06/08/2020 Document Reviewed: 06/08/2020 Elsevier Patient Education  2023 Else[employer] Inc.  

## 2022-08-24 LAB — CMP14+EGFR
ALT: 48 IU/L — ABNORMAL HIGH (ref 0–44)
AST: 36 IU/L (ref 0–40)
Albumin/Globulin Ratio: 2 (ref 1.2–2.2)
Albumin: 4.7 g/dL (ref 3.8–4.9)
Alkaline Phosphatase: 65 IU/L (ref 44–121)
BUN/Creatinine Ratio: 19 (ref 10–24)
BUN: 20 mg/dL (ref 8–27)
Bilirubin Total: 0.4 mg/dL (ref 0.0–1.2)
CO2: 21 mmol/L (ref 20–29)
Calcium: 9.8 mg/dL (ref 8.6–10.2)
Chloride: 100 mmol/L (ref 96–106)
Creatinine, Ser: 1.04 mg/dL (ref 0.76–1.27)
Globulin, Total: 2.3 g/dL (ref 1.5–4.5)
Glucose: 114 mg/dL — ABNORMAL HIGH (ref 70–99)
Potassium: 3.9 mmol/L (ref 3.5–5.2)
Sodium: 138 mmol/L (ref 134–144)
Total Protein: 7 g/dL (ref 6.0–8.5)
eGFR: 82 mL/min/{1.73_m2} (ref >59)

## 2022-08-24 LAB — CBC WITH DIFFERENTIAL/PLATELET
Basophils Absolute: 0.1 10*3/uL (ref 0.0–0.2)
Basos: 1 %
EOS (ABSOLUTE): 0.2 10*3/uL (ref 0.0–0.4)
Eos: 2 %
Hematocrit: 46.6 % (ref 37.5–51.0)
Hemoglobin: 16.1 g/dL (ref 13.0–17.7)
Immature Grans (Abs): 0 10*3/uL (ref 0.0–0.1)
Immature Granulocytes: 0 %
Lymphocytes Absolute: 2.3 10*3/uL (ref 0.7–3.1)
Lymphs: 23 %
MCH: 31.7 pg (ref 26.6–33.0)
MCHC: 34.5 g/dL (ref 31.5–35.7)
MCV: 92 fL (ref 79–97)
Monocytes Absolute: 0.7 10*3/uL (ref 0.1–0.9)
Monocytes: 7 %
Neutrophils Absolute: 6.5 10*3/uL (ref 1.4–7.0)
Neutrophils: 67 %
Platelets: 228 10*3/uL (ref 150–450)
RBC: 5.08 x10E6/uL (ref 4.14–5.80)
RDW: 12 % (ref 11.6–15.4)
WBC: 9.8 10*3/uL (ref 3.4–10.8)

## 2022-10-08 ENCOUNTER — Other Ambulatory Visit: Payer: Self-pay | Admitting: Family

## 2022-10-08 DIAGNOSIS — E785 Hyperlipidemia, unspecified: Secondary | ICD-10-CM

## 2022-10-20 ENCOUNTER — Other Ambulatory Visit: Payer: Self-pay | Admitting: Family

## 2022-10-20 DIAGNOSIS — I10 Essential (primary) hypertension: Secondary | ICD-10-CM

## 2023-02-21 ENCOUNTER — Ambulatory Visit: Payer: PRIVATE HEALTH INSURANCE | Admitting: Family

## 2023-02-25 ENCOUNTER — Ambulatory Visit (INDEPENDENT_AMBULATORY_CARE_PROVIDER_SITE_OTHER): Payer: PRIVATE HEALTH INSURANCE | Admitting: Family

## 2023-02-25 ENCOUNTER — Other Ambulatory Visit: Payer: Self-pay | Admitting: Family

## 2023-02-25 ENCOUNTER — Encounter: Payer: Self-pay | Admitting: Family

## 2023-02-25 VITALS — BP 132/81 | HR 78 | Temp 97.0°F | Ht 72.0 in | Wt 270.4 lb

## 2023-02-25 DIAGNOSIS — I10 Essential (primary) hypertension: Secondary | ICD-10-CM

## 2023-02-25 DIAGNOSIS — R7303 Prediabetes: Secondary | ICD-10-CM

## 2023-02-25 DIAGNOSIS — Z0001 Encounter for general adult medical examination with abnormal findings: Secondary | ICD-10-CM | POA: Diagnosis not present

## 2023-02-25 DIAGNOSIS — K219 Gastro-esophageal reflux disease without esophagitis: Secondary | ICD-10-CM

## 2023-02-25 DIAGNOSIS — K760 Fatty (change of) liver, not elsewhere classified: Secondary | ICD-10-CM

## 2023-02-25 DIAGNOSIS — E785 Hyperlipidemia, unspecified: Secondary | ICD-10-CM

## 2023-02-25 DIAGNOSIS — Z0000 Encounter for general adult medical examination without abnormal findings: Secondary | ICD-10-CM

## 2023-02-25 DIAGNOSIS — Z1211 Encounter for screening for malignant neoplasm of colon: Secondary | ICD-10-CM

## 2023-02-25 LAB — BAYER DCA HB A1C WAIVED: HB A1C (BAYER DCA - WAIVED): 5.6 % (ref 4.8–5.6)

## 2023-02-25 NOTE — Progress Notes (Deleted)
Annual Wellness Visit     Patient: Adrian Chaney, Male    DOB: 1961/07/06, 62 y.o.   MRN: [id number]  Subjective  Chief Complaint  Patient presents with   Medical Management of Chronic Issues    Adrian Chaney is a 62 y.o. male who presents today for his Annual Wellness Visit. He reports consuming a {diet types:17450} diet. {Exercise:19826} He generally feels {well/fairly well/poorly:18703}. He reports sleeping {well/fairly well/poorly:18703}. He {does/does not:200015} have additional problems to discuss today.   HPI  {VISON DENTAL STD PSA (Optional):27386}   {History (Optional):23778}  Medications: Outpatient Medications Prior to Visit  Medication Sig   aspirin EC 81 MG tablet Take 81 mg by mouth daily.   B Complex-C-Folic Acid (B COMPLEX-VITAMIN C-FOLIC ACID) 1 MG tablet Take by mouth.   hydrochlorothiazide (HYDRODIURIL) 25 MG tablet Take 1 tablet by mouth once daily   lisinopril (ZESTRIL) 40 MG tablet Take 1 tablet by mouth once daily   omeprazole (PRILOSEC) 10 MG capsule Take 10 mg by mouth daily.   rosuvastatin (CRESTOR) 5 MG tablet TAKE 1 TABLET BY MOUTH TWICE A WEEK   tetrahydrozoline-zinc (VISINE-AC) 0.05-0.25 % ophthalmic solution Place 2 drops into both eyes 3 (three) times daily as needed (dry eyes).   No facility-administered medications prior to visit.    No Known Allergies  Patient Care Team: Junie Spencer, FNP as PCP - General (Family Medicine)  ROS      Objective  BP 132/81   Pulse 78   Temp (!) 97 F (36.1 C) (Temporal)   Ht 6' (1.829 m)   Wt 270 lb 6.4 oz (122.7 kg)   SpO2 94%   BMI 36.67 kg/m  {Vitals History (Optional):23777}  Physical Exam    Most recent functional status assessment:     No data to display         Most recent fall risk assessment:    08/23/2022    2:11 PM  Fall Risk   Falls in the past year? 0  Number falls in past yr: 0  Injury with Fall? 0  Risk for fall due to : No Fall Risks  Follow up Falls  evaluation completed    Most recent depression screenings:    08/23/2022    2:10 PM 01/08/2022    3:16 PM  PHQ 2/9 Scores  PHQ - 2 Score 0 0  PHQ- 9 Score 0    Most recent cognitive screening:     No data to display         Most recent Audit-C alcohol use screening     No data to display         A score of 3 or more in women, and 4 or more in men indicates increased risk for alcohol abuse, EXCEPT if all of the points are from question 1   Vision/Hearing Screen: No results found.  {Labs (Optional):23779}  No results found for any visits on 02/25/23.    Assessment & Plan   Annual wellness visit done today including the all of the following: Reviewed patient's Family Medical History Reviewed and updated list of patient's medical providers Assessment of cognitive impairment was done Assessed patient's functional ability Established a written schedule for health screening services Health Risk Assessent Completed and Reviewed  Exercise Activities and Dietary recommendations  Goals   None     Immunization History  Administered Date(s) Administered   Influenza,inj,Quad PF,6+ Mos 08/12/2018   Tdap 06/07/2019    Health Maintenance  Topic Date Due   COVID-19 Vaccine (1) Never done   Colonoscopy  07/11/2022   Zoster Vaccines- Shingrix (1 of 2) 05/28/2023 (Originally 04/18/2012)   INFLUENZA VACCINE  04/10/2023   DTaP/Tdap/Td (2 - Td or Tdap) 06/06/2029   Hepatitis C Screening  Completed   HIV Screening  Completed   HPV VACCINES  Aged Out     Discussed health benefits of physical activity, and encouraged him to engage in regular exercise appropriate for his age and condition.    Problem List Items Addressed This Visit   None   No follow-ups on file.     [name], FNP

## 2023-02-25 NOTE — Patient Instructions (Addendum)
Prediabetes Prediabetes is when your blood sugar (blood glucose) level is higher than normal but not high enough for you to be diagnosed with type 2 diabetes. Having prediabetes puts you at risk for developing type 2 diabetes (type 2 diabetes mellitus). With certain lifestyle changes, you may be able to prevent or delay the onset of type 2 diabetes. This is important because type 2 diabetes can lead to serious complications, such as: Heart disease. Stroke. Blindness. Kidney disease. Depression. Poor circulation in the feet and legs. In severe cases, this could lead to surgical removal of a leg (amputation). What are the causes? The exact cause of prediabetes is not known. It may result from insulin resistance. Insulin resistance develops when cells in the body do not respond properly to insulin that the body makes. This can cause excess glucose to build up in the blood. High blood glucose (hyperglycemia) can develop. What increases the risk? The following factors may make you more likely to develop this condition: You have a family member with type 2 diabetes. You are older than 45 years. You had a temporary form of diabetes during a pregnancy (gestational diabetes). You had polycystic ovary syndrome (PCOS). You are overweight or obese. You are inactive (sedentary). You have a history of heart disease, including problems with cholesterol levels, high levels of blood fats, or high blood pressure. What are the signs or symptoms? You may have no symptoms. If you do have symptoms, they may include: Increased hunger. Increased thirst. Increased urination. Vision changes, such as blurry vision. Tiredness (fatigue). How is this diagnosed? This condition can be diagnosed with blood tests. Your blood glucose may be checked with one or more of the following tests: A fasting blood glucose (FBG) test. You will not be allowed to eat (you will fast) for at least 8 hours before a blood sample is  taken. An A1C blood test (hemoglobin A1C). This test provides information about blood glucose levels over the previous 2?3 months. An oral glucose tolerance test (OGTT). This test measures your blood glucose at two points in time: After fasting. This is your baseline level. Two hours after you drink a beverage that contains glucose. You may be diagnosed with prediabetes if: Your FBG is 100?125 mg/dL (5.6-6.9 mmol/L). Your A1C level is 5.7?6.4% (39-46 mmol/mol). Your OGTT result is 140?199 mg/dL (7.8-11 mmol/L). These blood tests may be repeated to confirm your diagnosis. How is this treated? Treatment may include dietary and lifestyle changes to help lower your blood glucose and prevent type 2 diabetes from developing. In some cases, medicine may be prescribed to help lower the risk of type 2 diabetes. Follow these instructions at home: Nutrition  Follow a healthy meal plan. This includes eating lean proteins, whole grains, legumes, fresh fruits and vegetables, low-fat dairy products, and healthy fats. Follow instructions from your health care provider about eating or drinking restrictions. Meet with a dietitian to create a healthy eating plan that is right for you. Lifestyle Do moderate-intensity exercise for at least 30 minutes a day on 5 or more days each week, or as told by your health care provider. A mix of activities may be best, such as: Brisk walking, swimming, biking, and weight lifting. Lose weight as told by your health care provider. Losing 5-7% of your body weight can reverse insulin resistance. Do not drink alcohol if: Your health care provider tells you not to drink. You are pregnant, may be pregnant, or are planning to become pregnant. If you drink alcohol:   Limit how much you use to: 0-1 drink a day for women. 0-2 drinks a day for men. Be aware of how much alcohol is in your drink. In the [place] one drink equals one 12 oz bottle of beer (355 mL), one 5 oz glass of wine  (148 mL), or one 1 oz glass of hard liquor (44 mL). General instructions Take over-the-counter and prescription medicines only as told by your health care provider. You may be prescribed medicines that help lower the risk of type 2 diabetes. Do not use any products that contain nicotine or tobacco, such as cigarettes, e-cigarettes, and chewing tobacco. If you need help quitting, ask your health care provider. Keep all follow-up visits. This is important. Where to find more information American Diabetes Association: www.diabetes.org Academy of Nutrition and Dietetics: www.eatright.org American Heart Association: www.heart.org Contact a health care provider if: You have any of these symptoms: Increased hunger. Increased urination. Increased thirst. Fatigue. Vision changes, such as blurry vision. Get help right away if you: Have shortness of breath. Feel confused. Vomit or feel like you may vomit. Summary Prediabetes is when your blood sugar (blood glucose)level is higher than normal but not high enough for you to be diagnosed with type 2 diabetes. Having prediabetes puts you at risk for developing type 2 diabetes (type 2 diabetes mellitus). Make lifestyle changes such as eating a healthy diet and exercising regularly to help prevent diabetes. Lose weight as told by your health care provider. This information is not intended to replace advice given to you by your health care provider. Make sure you discuss any questions you have with your health care provider.  Prediabetes Eating Plan Prediabetes is a condition that causes blood sugar (glucose) levels to be higher than normal. This increases the risk for developing type 2 diabetes (type 2 diabetes mellitus). Working with a health care provider or nutrition specialist (dietitian) to make diet and lifestyle changes can help prevent the onset of diabetes. These changes may help you: Control your blood glucose levels. Improve your cholesterol  levels. Manage your blood pressure. What are tips for following this plan? Reading food labels Read food labels to check the amount of fat, salt (sodium), and sugar in prepackaged foods. Avoid foods that have: Saturated fats. Trans fats. Added sugars. Avoid foods that have more than 300 milligrams (mg) of sodium per serving. Limit your sodium intake to less than 2,300 mg each day. Shopping Avoid buying pre-made and processed foods. Avoid buying drinks with added sugar. Cooking Cook with olive oil. Do not use butter, lard, or ghee. Bake, broil, grill, steam, or boil foods. Avoid frying. Meal planning  Work with your dietitian to create an eating plan that is right for you. This may include tracking how many calories you take in each day. Use a food diary, notebook, or mobile application to track what you eat at each meal. Consider following a Mediterranean diet. This includes: Eating several servings of fresh fruits and vegetables each day. Eating fish at least twice a week. Eating one serving each day of whole grains, beans, nuts, and seeds. Using olive oil instead of other fats. Limiting alcohol. Limiting red meat. Using nonfat or low-fat dairy products. Consider following a plant-based diet. This includes dietary choices that focus on eating mostly vegetables and fruit, grains, beans, nuts, and seeds. If you have high blood pressure, you may need to limit your sodium intake or follow a diet such as the DASH (Dietary Approaches to Stop Hypertension) eating plan.  The DASH diet aims to lower high blood pressure. Lifestyle Set weight loss goals with help from your health care team. It is recommended that most people with prediabetes lose 7% of their body weight. Exercise for at least 30 minutes 5 or more days a week. Attend a support group or seek support from a mental health counselor. Take over-the-counter and prescription medicines only as told by your health care provider. What  foods are recommended? Fruits Berries. Bananas. Apples. Oranges. Grapes. Papaya. Mango. Pomegranate. Kiwi. Grapefruit. Cherries. Vegetables Lettuce. Spinach. Peas. Beets. Cauliflower. Cabbage. Broccoli. Carrots. Tomatoes. Squash. Eggplant. Herbs. Peppers. Onions. Cucumbers. Brussels sprouts. Grains Whole grains, such as whole-wheat or whole-grain breads, crackers, cereals, and pasta. Unsweetened oatmeal. Bulgur. Barley. Quinoa. Brown rice. Corn or whole-wheat flour tortillas or taco shells. Meats and other proteins Seafood. Poultry without skin. Lean cuts of pork and beef. Tofu. Eggs. Nuts. Beans. Dairy Low-fat or fat-free dairy products, such as yogurt, cottage cheese, and cheese. Beverages Water. Tea. Coffee. Sugar-free or diet soda. Seltzer water. Low-fat or nonfat milk. Milk alternatives, such as soy or almond milk. Fats and oils Olive oil. Canola oil. Sunflower oil. Grapeseed oil. Avocado. Walnuts. Sweets and desserts Sugar-free or low-fat pudding. Sugar-free or low-fat ice cream and other frozen treats. Seasonings and condiments Herbs. Sodium-free spices. Mustard. Relish. Low-salt, low-sugar ketchup. Low-salt, low-sugar barbecue sauce. Low-fat or fat-free mayonnaise. The items listed above may not be a complete list of recommended foods and beverages. Contact a dietitian for more information. What foods are not recommended? Fruits Fruits canned with syrup. Vegetables Canned vegetables. Frozen vegetables with butter or cream sauce. Grains Refined white flour and flour products, such as bread, pasta, snack foods, and cereals. Meats and other proteins Fatty cuts of meat. Poultry with skin. Breaded or fried meat. Processed meats. Dairy Full-fat yogurt, cheese, or milk. Beverages Sweetened drinks, such as iced tea and soda. Fats and oils Butter. Lard. Ghee. Sweets and desserts Baked goods, such as cake, cupcakes, pastries, cookies, and cheesecake. Seasonings and  condiments Spice mixes with added salt. Ketchup. Barbecue sauce. Mayonnaise. The items listed above may not be a complete list of foods and beverages that are not recommended. Contact a dietitian for more information. Where to find more information American Diabetes Association: www.diabetes.org Summary You may need to make diet and lifestyle changes to help prevent the onset of diabetes. These changes can help you control blood sugar, improve cholesterol levels, and manage blood pressure. Set weight loss goals with help from your health care team. It is recommended that most people with prediabetes lose 7% of their body weight. Consider following a Mediterranean diet. This includes eating plenty of fresh fruits and vegetables, whole grains, beans, nuts, seeds, fish, and low-fat dairy, and using olive oil instead of other fats. This information is not intended to replace advice given to you by your health care provider. Make sure you discuss any questions you have with your health care provider. Document Revised: 11/25/2019 Document Reviewed: 11/25/2019 Elsevier Patient Education  2024 [employer] Inc.  Document Revised: 11/25/2019 Document Reviewed: 11/25/2019 Elsevier Patient Education  2024 [employer].

## 2023-02-25 NOTE — Progress Notes (Signed)
Subjective:    Patient ID: Adrian Chaney, male    DOB: Sep 21, 1960, 62 y.o.   MRN: [id number]  Chief Complaint  Patient presents with   Medical Management of Chronic Issues   Pt presents to the office today for CPE and  chronic follow up. Pt is morbid obese with BMI 36 with co morbidity of HTN and GERD.    He has fatty liver. He tries to eat a low fat diet at times.  Drinks a beer rarely.  Hypertension This is a chronic problem. The current episode started more than 1 year ago. The problem has been resolved since onset. The problem is controlled. Associated symptoms include peripheral edema (right ankle). Pertinent negatives include no malaise/fatigue or shortness of breath. Risk factors for coronary artery disease include obesity, dyslipidemia and male gender. The current treatment provides moderate improvement.  Gastroesophageal Reflux He complains of belching and heartburn. This is a chronic problem. The current episode started more than 1 year ago. The problem occurs occasionally. Risk factors include obesity and smoking/tobacco exposure. He has tried a PPI for the symptoms. The treatment provided moderate relief.  Hyperlipidemia This is a chronic problem. The current episode started more than 1 year ago. The problem is controlled. Recent lipid tests were reviewed and are normal. Exacerbating diseases include obesity. Pertinent negatives include no shortness of breath. Current antihyperlipidemic treatment includes statins. The current treatment provides moderate improvement of lipids. Risk factors for coronary artery disease include dyslipidemia, hypertension and a sedentary lifestyle.  Diabetes He presents for his follow-up diabetic visit. Diabetes type: prediabtic.      Review of Systems  Constitutional:  Negative for malaise/fatigue.  Respiratory:  Negative for shortness of breath.   Gastrointestinal:  Positive for heartburn.  All other systems reviewed and are negative.  Family  History  Problem Relation Age of Onset   Hypertension Mother    Diabetes Father    Cancer Father        colon   Colon cancer Father    Scoliosis Brother    Esophageal cancer Neg Hx    Rectal cancer Neg Hx    Stomach cancer Neg Hx    Social History   Socioeconomic History   Marital status: Single    Spouse name: Not on file   Number of children: Not on file   Years of education: Not on file   Highest education level: Not on file  Occupational History   Not on file  Tobacco Use   Smoking status: Former   Smokeless tobacco: Never  Vaping Use   Vaping Use: Never used  Substance and Sexual Activity   Alcohol use: Not Currently    Alcohol/week: 1.0 - 2.0 standard drink of alcohol    Types: 1 - 2 Cans of beer per week   Drug use: Yes    Frequency: 1.0 times per week    Types: Marijuana    Comment: 1 joint a week.    Sexual activity: Not on file  Other Topics Concern   Not on file  Social History Narrative   Not on file   Social Determinants of Health   Financial Resource Strain: Not on file  Food Insecurity: Not on file  Transportation Needs: Not on file  Physical Activity: Not on file  Stress: Not on file  Social Connections: Not on file       Objective:   Physical Exam Vitals reviewed.  Constitutional:      General: He is  not in acute distress.    Appearance: He is well-developed. He is obese.  HENT:     Head: Normocephalic.     Right Ear: Tympanic membrane normal.     Left Ear: Tympanic membrane normal.  Eyes:     General:        Right eye: No discharge.        Left eye: No discharge.     Pupils: Pupils are equal, round, and reactive to light.  Neck:     Thyroid: No thyromegaly.  Cardiovascular:     Rate and Rhythm: Normal rate and regular rhythm.     Heart sounds: Normal heart sounds. No murmur heard. Pulmonary:     Effort: Pulmonary effort is normal. No respiratory distress.     Breath sounds: Normal breath sounds. No wheezing.  Abdominal:      General: Bowel sounds are normal. There is no distension.     Palpations: Abdomen is soft.     Tenderness: There is no abdominal tenderness.  Musculoskeletal:        General: No tenderness. Normal range of motion.     Cervical back: Normal range of motion and neck supple.  Skin:    General: Skin is warm and dry.     Findings: No erythema or rash.  Neurological:     Mental Status: He is alert and oriented to person, place, and time.     Cranial Nerves: No cranial nerve deficit.     Deep Tendon Reflexes: Reflexes are normal and symmetric.  Psychiatric:        Behavior: Behavior normal.        Thought Content: Thought content normal.        Judgment: Judgment normal.       BP 132/81   Pulse 78   Temp (!) 97 F (36.1 C) (Temporal)   Ht 6' (1.829 m)   Wt 270 lb 6.4 oz (122.7 kg)   SpO2 94%   BMI 36.67 kg/m      Assessment & Plan:  SOSUKE CORTRIGHT comes in today with chief complaint of Medical Management of Chronic Issues   Diagnosis and orders addressed:  1. Annual physical exam - CBC with Differential/Platelet - CMP14+EGFR - Lipid panel - PSA, total and free - TSH  2. Essential hypertension - CMP14+EGFR  3. Morbid obesity (HCC) - CMP14+EGFR  4. Hyperlipidemia, unspecified hyperlipidemia type - CMP14+EGFR - Lipid panel  5. Gastroesophageal reflux disease, unspecified whether esophagitis present - CMP14+EGFR  6. Fatty liver - CMP14+EGFR  7. Prediabetes - CMP14+EGFR  8. Colon cancer screening - Ambulatory referral to Gastroenterology - CMP14+EGFR   Labs pending Health Maintenance reviewed Diet and exercise encouraged  Follow up plan: 6 months    [name], FNP

## 2023-02-26 LAB — CBC WITH DIFFERENTIAL/PLATELET
Basophils Absolute: 0.1 10*3/uL (ref 0.0–0.2)
Basos: 1 %
EOS (ABSOLUTE): 0.1 10*3/uL (ref 0.0–0.4)
Eos: 1 %
Hematocrit: 45.4 % (ref 37.5–51.0)
Hemoglobin: 15.9 g/dL (ref 13.0–17.7)
Immature Grans (Abs): 0 10*3/uL (ref 0.0–0.1)
Immature Granulocytes: 0 %
Lymphocytes Absolute: 1.6 10*3/uL (ref 0.7–3.1)
Lymphs: 21 %
MCH: 32.1 pg (ref 26.6–33.0)
MCHC: 35 g/dL (ref 31.5–35.7)
MCV: 92 fL (ref 79–97)
Monocytes Absolute: 0.6 10*3/uL (ref 0.1–0.9)
Monocytes: 7 %
Neutrophils Absolute: 5.5 10*3/uL (ref 1.4–7.0)
Neutrophils: 70 %
Platelets: 201 10*3/uL (ref 150–450)
RBC: 4.95 x10E6/uL (ref 4.14–5.80)
RDW: 12.2 % (ref 11.6–15.4)
WBC: 7.9 10*3/uL (ref 3.4–10.8)

## 2023-02-26 LAB — CMP14+EGFR
ALT: 52 IU/L — ABNORMAL HIGH (ref 0–44)
AST: 43 IU/L — ABNORMAL HIGH (ref 0–40)
Albumin: 4.7 g/dL (ref 3.8–4.9)
Alkaline Phosphatase: 64 IU/L (ref 44–121)
BUN/Creatinine Ratio: 13 (ref 10–24)
BUN: 16 mg/dL (ref 8–27)
Bilirubin Total: 0.5 mg/dL (ref 0.0–1.2)
CO2: 24 mmol/L (ref 20–29)
Calcium: 9.7 mg/dL (ref 8.6–10.2)
Chloride: 102 mmol/L (ref 96–106)
Creatinine, Ser: 1.24 mg/dL (ref 0.76–1.27)
Globulin, Total: 2.1 g/dL (ref 1.5–4.5)
Glucose: 120 mg/dL — ABNORMAL HIGH (ref 70–99)
Potassium: 3.9 mmol/L (ref 3.5–5.2)
Sodium: 140 mmol/L (ref 134–144)
Total Protein: 6.8 g/dL (ref 6.0–8.5)
eGFR: 67 mL/min/{1.73_m2} (ref >59)

## 2023-02-26 LAB — LIPID PANEL
Chol/HDL Ratio: 5.7 ratio — ABNORMAL HIGH (ref 0.0–5.0)
Cholesterol, Total: 176 mg/dL (ref 100–199)
HDL: 31 mg/dL — ABNORMAL LOW (ref >39)
LDL Chol Calc (NIH): 101 mg/dL — ABNORMAL HIGH (ref 0–99)
Triglycerides: 257 mg/dL — ABNORMAL HIGH (ref 0–149)
VLDL Cholesterol Cal: 44 mg/dL — ABNORMAL HIGH (ref 5–40)

## 2023-02-26 LAB — PSA, TOTAL AND FREE
PSA, Free Pct: 29.4 %
PSA, Free: 0.47 ng/mL
Prostate Specific Ag, Serum: 1.6 ng/mL (ref 0.0–4.0)

## 2023-02-26 LAB — TSH: TSH: 2.38 u[IU]/mL (ref 0.450–4.500)

## 2023-02-27 ENCOUNTER — Other Ambulatory Visit: Payer: Self-pay | Admitting: Family

## 2023-02-27 MED ORDER — ROSUVASTATIN CALCIUM 10 MG PO TABS: 10.0000 mg | ORAL_TABLET | Freq: Every day | ORAL | 3 refills | Status: DC

## 2023-04-24 ENCOUNTER — Other Ambulatory Visit: Payer: Self-pay | Admitting: Family

## 2023-04-24 DIAGNOSIS — I10 Essential (primary) hypertension: Secondary | ICD-10-CM

## 2023-08-28 ENCOUNTER — Telehealth (INDEPENDENT_AMBULATORY_CARE_PROVIDER_SITE_OTHER): Payer: Self-pay | Admitting: Family

## 2023-08-28 ENCOUNTER — Encounter: Payer: Self-pay | Admitting: Family

## 2023-08-28 DIAGNOSIS — K219 Gastro-esophageal reflux disease without esophagitis: Secondary | ICD-10-CM

## 2023-08-28 DIAGNOSIS — R7303 Prediabetes: Secondary | ICD-10-CM

## 2023-08-28 DIAGNOSIS — Z1211 Encounter for screening for malignant neoplasm of colon: Secondary | ICD-10-CM

## 2023-08-28 DIAGNOSIS — E785 Hyperlipidemia, unspecified: Secondary | ICD-10-CM

## 2023-08-28 DIAGNOSIS — I10 Essential (primary) hypertension: Secondary | ICD-10-CM

## 2023-08-28 NOTE — Progress Notes (Signed)
Virtual Visit Consent   Adrian Chaney, you are scheduled for a virtual visit with a [facility] provider today. Just as with appointments in the office, your consent must be obtained to participate. Your consent will be active for this visit and any virtual visit you may have with one of our providers in the next 365 days. If you have a MyChart account, a copy of this consent can be sent to you electronically.  As this is a virtual visit, video technology does not allow for your provider to perform a traditional examination. This may limit your provider's ability to fully assess your condition. If your provider identifies any concerns that need to be evaluated in person or the need to arrange testing (such as labs, EKG, etc.), we will make arrangements to do so. Although advances in technology are sophisticated, we cannot ensure that it will always work on either your end or our end. If the connection with a video visit is poor, the visit may have to be switched to a telephone visit. With either a video or telephone visit, we are not always able to ensure that we have a secure connection.  By engaging in this virtual visit, you consent to the provision of healthcare and authorize for your insurance to be billed (if applicable) for the services provided during this visit. Depending on your insurance coverage, you may receive a charge related to this service.  I need to obtain your verbal consent now. Are you willing to proceed with your visit today? Adrian Chaney has provided verbal consent on 08/28/2023 for a virtual visit (video or telephone). Adrian Rodney, FNP  Date: 08/28/2023 1:31 PM  Virtual Visit via Video Note   I, Adrian Chaney, connected with  Adrian Chaney  ([id number], May 25, 1961) on 08/28/23 at  3:25 PM EST by a video-enabled telemedicine application and verified that I am speaking with the correct person using two identifiers.  Location: Patient: Virtual Visit Location Patient:  car Provider: Virtual Visit Location Provider: Home Office   I discussed the limitations of evaluation and management by telemedicine and the availability of in person appointments. The patient expressed understanding and agreed to proceed.    History of Present Illness: Adrian Chaney is a 62 y.o. who identifies as a male who was assigned male at birth, and is being seen today for chronic follow up. Pt is morbid obese with BMI 36 with co morbidity of HTN and GERD.     He has fatty liver. He tries to eat a low fat diet at times.  Drinks a beer rarely.   HPI: Hypertension This is a chronic problem. The current episode started more than 1 year ago. The problem has been resolved since onset. The problem is controlled. Pertinent negatives include no blurred vision, malaise/fatigue, peripheral edema or shortness of breath. Risk factors for coronary artery disease include obesity, male gender and sedentary lifestyle. The current treatment provides moderate improvement.  Gastroesophageal Reflux He complains of belching and heartburn. This is a chronic problem. The current episode started more than 1 year ago. The problem occurs occasionally. He has tried a PPI for the symptoms. The treatment provided moderate relief.  Hyperlipidemia This is a chronic problem. The current episode started more than 1 year ago. The problem is controlled. Recent lipid tests were reviewed and are normal. Exacerbating diseases include obesity. Pertinent negatives include no shortness of breath. Current antihyperlipidemic treatment includes statins. The current treatment provides mild improvement of lipids. Risk  factors for coronary artery disease include hypertension, male sex, dyslipidemia and a sedentary lifestyle.  Diabetes He presents for his follow-up diabetic visit. Diabetes type: prediabetes. Pertinent negatives for diabetes include no blurred vision and no foot paresthesias. Risk factors for coronary artery disease  include diabetes mellitus, hypertension and sedentary lifestyle. (Does not check glucose at home) Eye exam is not current.   Problems:  Patient Active Problem List   Diagnosis Date Noted   Prediabetes 01/14/2022   Hyperlipemia 07/27/2020   Fatty liver 07/27/2020   Morbid obesity (HCC) 09/29/2018   Essential hypertension 09/29/2018   GERD (gastroesophageal reflux disease) 09/29/2018   Stiffness of right ankle joint 09/08/2018    Allergies: No Known Allergies Medications:  Current Outpatient Medications:    aspirin EC 81 MG tablet, Take 81 mg by mouth daily., Disp: , Rfl:    B Complex-C-Folic Acid (B COMPLEX-VITAMIN C-FOLIC ACID) 1 MG tablet, Take by mouth., Disp: , Rfl:    hydrochlorothiazide (HYDRODIURIL) 25 MG tablet, Take 1 tablet by mouth once daily, Disp: 90 tablet, Rfl: 1   lisinopril (ZESTRIL) 40 MG tablet, Take 1 tablet by mouth once daily, Disp: 90 tablet, Rfl: 1   omeprazole (PRILOSEC) 10 MG capsule, Take 10 mg by mouth daily., Disp: , Rfl:    rosuvastatin (CRESTOR) 10 MG tablet, Take 1 tablet (10 mg total) by mouth daily., Disp: 90 tablet, Rfl: 3   tetrahydrozoline-zinc (VISINE-AC) 0.05-0.25 % ophthalmic solution, Place 2 drops into both eyes 3 (three) times daily as needed (dry eyes)., Disp: , Rfl:   Observations/Objective: Patient is well-developed, well-nourished in no acute distress.  Resting comfortably  at home.  Head is normocephalic, atraumatic.  No labored breathing.  Speech is clear and coherent with logical content.  Patient is alert and oriented at baseline.    Assessment and Plan: 1. Essential hypertension (Primary)  2. Hyperlipidemia, unspecified hyperlipidemia type  3. Prediabetes  4. Morbid obesity (HCC)  5. Gastroesophageal reflux disease, unspecified whether esophagitis present  Continue current medications  Labs pending  Follow up in 6 months   Follow Up Instructions: I discussed the assessment and treatment plan with the patient. The  patient was provided an opportunity to ask questions and all were answered. The patient agreed with the plan and demonstrated an understanding of the instructions.  A copy of instructions were sent to the patient via MyChart unless otherwise noted below.     The patient was advised to call back or seek an in-person evaluation if the symptoms worsen or if the condition fails to improve as anticipated.    Adrian Rodney, FNP

## 2023-10-12 ENCOUNTER — Emergency Department (HOSPITAL_BASED_OUTPATIENT_CLINIC_OR_DEPARTMENT_OTHER): Payer: Self-pay | Admitting: Radiology

## 2023-10-12 ENCOUNTER — Other Ambulatory Visit: Payer: Self-pay

## 2023-10-12 ENCOUNTER — Encounter (HOSPITAL_BASED_OUTPATIENT_CLINIC_OR_DEPARTMENT_OTHER): Payer: Self-pay

## 2023-10-12 ENCOUNTER — Emergency Department (HOSPITAL_BASED_OUTPATIENT_CLINIC_OR_DEPARTMENT_OTHER)
Admission: EM | Admit: 2023-10-12 | Discharge: 2023-10-12 | Disposition: A | Discharge status: Home / Self Care | Payer: Self-pay | Attending: Emergency Medicine | Admitting: Emergency Medicine

## 2023-10-12 DIAGNOSIS — I10 Essential (primary) hypertension: Secondary | ICD-10-CM | POA: Insufficient documentation

## 2023-10-12 DIAGNOSIS — Z79899 Other long term (current) drug therapy: Secondary | ICD-10-CM | POA: Insufficient documentation

## 2023-10-12 DIAGNOSIS — Z7982 Long term (current) use of aspirin: Secondary | ICD-10-CM | POA: Insufficient documentation

## 2023-10-12 DIAGNOSIS — Z20822 Contact with and (suspected) exposure to covid-19: Secondary | ICD-10-CM | POA: Insufficient documentation

## 2023-10-12 DIAGNOSIS — R112 Nausea with vomiting, unspecified: Secondary | ICD-10-CM

## 2023-10-12 DIAGNOSIS — J101 Influenza due to other identified influenza virus with other respiratory manifestations: Secondary | ICD-10-CM | POA: Insufficient documentation

## 2023-10-12 LAB — BASIC METABOLIC PANEL
Anion gap: 12 (ref 5–15)
BUN: 16 mg/dL (ref 8–23)
CO2: 25 mmol/L (ref 22–32)
Calcium: 9.7 mg/dL (ref 8.9–10.3)
Chloride: 99 mmol/L (ref 98–111)
Creatinine, Ser: 0.99 mg/dL (ref 0.61–1.24)
GFR, Estimated: >60 mL/min (ref >60)
Glucose, Bld: 124 mg/dL — ABNORMAL HIGH (ref 70–99)
Potassium: 3.7 mmol/L (ref 3.5–5.1)
Sodium: 136 mmol/L (ref 135–145)

## 2023-10-12 LAB — CBC
HCT: 46.6 % (ref 39.0–52.0)
Hemoglobin: 16.6 g/dL (ref 13.0–17.0)
MCH: 31.9 pg (ref 26.0–34.0)
MCHC: 35.6 g/dL (ref 30.0–36.0)
MCV: 89.4 fL (ref 80.0–100.0)
Platelets: 148 10*3/uL — ABNORMAL LOW (ref 150–400)
RBC: 5.21 MIL/uL (ref 4.22–5.81)
RDW: 12.2 % (ref 11.5–15.5)
WBC: 10 10*3/uL (ref 4.0–10.5)
nRBC: 0 % (ref 0.0–0.2)

## 2023-10-12 LAB — RESP PANEL BY RT-PCR (RSV, FLU A&B, COVID)  RVPGX2
Influenza A by PCR: POSITIVE — AB
Influenza B by PCR: NEGATIVE
Resp Syncytial Virus by PCR: NEGATIVE
SARS Coronavirus 2 by RT PCR: NEGATIVE

## 2023-10-12 LAB — TROPONIN I (HIGH SENSITIVITY): Troponin I (High Sensitivity): 7 ng/L (ref <18)

## 2023-10-12 MED ORDER — SODIUM CHLORIDE 0.9 % IV BOLUS
1000.0000 mL | Freq: Once | INTRAVENOUS | Status: AC
  Administered 2023-10-12: 1000 mL via INTRAVENOUS

## 2023-10-12 MED ORDER — ACETAMINOPHEN 500 MG PO TABS: 500.0000 mg | ORAL_TABLET | Freq: Four times a day (QID) | ORAL | 0 refills | Status: AC | PRN

## 2023-10-12 MED ORDER — ONDANSETRON HCL 4 MG/2ML IJ SOLN
4.0000 mg | Freq: Once | INTRAMUSCULAR | Status: AC
  Administered 2023-10-12: 4 mg via INTRAVENOUS
  Filled 2023-10-12: qty 2

## 2023-10-12 MED ORDER — ACETAMINOPHEN 500 MG PO TABS
1000.0000 mg | ORAL_TABLET | Freq: Once | ORAL | Status: AC
  Administered 2023-10-12: 1000 mg via ORAL
  Filled 2023-10-12: qty 2

## 2023-10-12 MED ORDER — ONDANSETRON HCL 4 MG PO TABS: 4.0000 mg | ORAL_TABLET | Freq: Three times a day (TID) | ORAL | 0 refills | Status: AC | PRN

## 2023-10-12 NOTE — ED Triage Notes (Signed)
CP, SOB x1 days.  +N/+V/+D- all started yesterday.

## 2023-10-12 NOTE — ED Provider Notes (Signed)
[place] EMERGENCY DEPARTMENT AT [facility] Provider Note   CSN: [id number] Arrival date & time: 10/12/23  1656     History  No chief complaint on file.   Adrian Chaney is a 63 y.o. male.  The history is provided by the patient and medical records. No language interpreter was used.     63 year old male history of hypertension, hyperlipidemia, prediabetes, obesity presenting with flulike symptoms.  Patient report for the past 3 to 4 days he has had fever, chills, body aches, headache, chest pain, neck pain, sore throat, nausea vomiting diarrhea, decreased appetite, and feels very dehydrated.  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  He tries over-the-counter medication at home with minimal relief.  He reported feeling very weak.  He endorsed pain in his chest and abdomen from persistent coughing.  Cough is nonproductive.  Home Medications Prior to Admission medications   Medication Sig Start Date End Date Taking? Authorizing Provider  aspirin EC 81 MG tablet Take 81 mg by mouth daily.    [provider]  B Complex-C-Folic Acid (B COMPLEX-VITAMIN C-FOLIC ACID) 1 MG tablet Take by mouth.    [provider]  hydrochlorothiazide (HYDRODIURIL) 25 MG tablet Take 1 tablet by mouth once daily 04/24/23   [name] A, FNP  lisinopril (ZESTRIL) 40 MG tablet Take 1 tablet by mouth once daily 04/24/23   [name] A, FNP  omeprazole (PRILOSEC) 10 MG capsule Take 10 mg by mouth daily.    [provider]  rosuvastatin (CRESTOR) 10 MG tablet Take 1 tablet (10 mg total) by mouth daily. 02/27/23   Junie Spencer, FNP  tetrahydrozoline-zinc (VISINE-AC) 0.05-0.25 % ophthalmic solution Place 2 drops into both eyes 3 (three) times daily as needed (dry eyes).    [provider]      Allergies    Patient has no known allergies.    Review of Systems   Review of Systems  All other systems reviewed and are negative.   Physical  Exam Updated Vital Signs BP (!) 151/82 (BP Location: Left Arm)   Pulse 99   Temp 98.4 F (36.9 C) (Oral)   Resp 20   SpO2 95%  Physical Exam Vitals and nursing note reviewed.  Constitutional:      General: He is not in acute distress.    Appearance: He is well-developed.  HENT:     Head: Atraumatic.     Mouth/Throat:     Mouth: Mucous membranes are moist.  Eyes:     Conjunctiva/sclera: Conjunctivae normal.  Cardiovascular:     Rate and Rhythm: Tachycardia present.     Pulses: Normal pulses.     Heart sounds: Normal heart sounds.  Pulmonary:     Effort: Pulmonary effort is normal.     Breath sounds: Normal breath sounds.  Abdominal:     Palpations: Abdomen is soft.     Tenderness: There is no abdominal tenderness.  Musculoskeletal:     Cervical back: Normal range of motion and neck supple. No rigidity.  Skin:    Findings: No rash.  Neurological:     Mental Status: He is alert.     ED Results / Procedures / Treatments   Labs (all labs ordered are listed, but only abnormal results are displayed) Labs Reviewed  RESP PANEL BY RT-PCR (RSV, FLU A&B, COVID)  RVPGX2 - Abnormal; Notable for the following components:      Result Value   Influenza A by PCR  POSITIVE (*)    All other components within normal limits  BASIC METABOLIC PANEL - Abnormal; Notable for the following components:   Glucose, Bld 124 (*)    All other components within normal limits  CBC - Abnormal; Notable for the following components:   Platelets 148 (*)    All other components within normal limits  TROPONIN I (HIGH SENSITIVITY)  TROPONIN I (HIGH SENSITIVITY)    EKG None  Radiology DG Chest 2 View Result Date: 10/12/2023 CLINICAL DATA:  Chest pain. Central left chest pain radiating to the neck. Numbness down the right arm. Symptoms for 2 days. Nausea, vomiting, and diarrhea EXAM: CHEST - 2 VIEW COMPARISON:  None Available. FINDINGS: The heart size and mediastinal contours are within normal limits.  Both lungs are clear. The visualized skeletal structures are unremarkable. IMPRESSION: No active cardiopulmonary disease. Electronically Signed   By: Burman Nieves M.D.   On: 10/12/2023 17:34    Procedures Procedures    Medications Ordered in ED Medications  sodium chloride 0.9 % bolus 1,000 mL (0 mLs Intravenous Stopped 10/12/23 2104)  ondansetron (ZOFRAN) injection 4 mg (4 mg Intravenous Given 10/12/23 1952)  acetaminophen (TYLENOL) tablet 1,000 mg (1,000 mg Oral Given 10/12/23 2005)    ED Course/ Medical Decision Making/ A&P                                 Medical Decision Making Amount and/or Complexity of Data Reviewed Labs: ordered. Radiology: ordered.  Risk OTC drugs. Prescription drug management.   BP (!) 151/82 (BP Location: Left Arm)   Pulse 99   Temp 98.4 F (36.9 C) (Oral)   Resp 20   SpO2 95%   49:64 PM  63 year old male history of hypertension, hyperlipidemia, prediabetes, obesity presenting with flulike symptoms.  Patient report for the past 3 to 4 days he has had fever, chills, body aches, headache, chest pain, neck pain, sore throat, nausea vomiting diarrhea, decreased appetite, and feels very dehydrated.  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  He tries over-the-counter medication at home with minimal relief.  He reported feeling very weak.  He endorsed pain in his chest and abdomen from persistent coughing.  Cough is nonproductive.  On exam, patient laying in bed appears tired but nontoxic.  Heart with no tachycardia, lungs are clear abdomen is soft and overall nontender.  Vital signs are reassuring.  -Labs ordered, independently viewed and interpreted by me.  Labs remarkable for influenza A positive -The patient was maintained on a cardiac monitor.  I personally viewed and interpreted the cardiac monitored which showed an underlying rhythm of: NSR -Imaging independently viewed and interpreted by me and I agree with radiologist's  interpretation.  Result remarkable for CXR without acute changes -This patient presents to the ED for concern of flu sxs, this involves an extensive number of treatment options, and is a complaint that carries with it a high risk of complications and morbidity.  The differential diagnosis includes flu, covid, rsv, pneumonia, viral illness, acs -Co morbidities that complicate the patient evaluation includes htn, hld, obesity -Treatment includes zofran, tylenol, ivf -Reevaluation of the patient after these medicines showed that the patient improved -PCP office notes or outside notes reviewed -Escalation to admission/observation considered: patients feels much better, is comfortable with discharge, and will follow up with PCP -Prescription medication considered, patient comfortable with zofran and tylenol -Social Determinant of Health considered which  includes hx of tobacco use         Final Clinical Impression(s) / ED Diagnoses Final diagnoses:  Influenza A  Nausea and vomiting, unspecified vomiting type    Rx / DC Orders ED Discharge Orders          Ordered    acetaminophen (TYLENOL) 500 MG tablet  Every 6 hours PRN        10/12/23 2115    ondansetron (ZOFRAN) 4 MG tablet  Every 8 hours PRN        10/12/23 2115              Fayrene Helper, PA-C 10/12/23 2118    Anders Simmonds T, DO 10/14/23 [phone]

## 2023-10-12 NOTE — Discharge Instructions (Signed)
You have been evaluated for your symptoms.  Your symptom is consistent with influenza A.  Please stay hydrated, take Tylenol as needed for aches and pain, take Zofran as needed for nausea and vomiting and follow-up closely with your doctor for further care.  Your symptoms are likely last for 1 to 2 weeks.

## 2023-11-03 ENCOUNTER — Other Ambulatory Visit: Payer: Self-pay | Admitting: Family

## 2023-11-03 DIAGNOSIS — I10 Essential (primary) hypertension: Secondary | ICD-10-CM

## 2024-02-05 ENCOUNTER — Other Ambulatory Visit: Payer: Self-pay | Admitting: Family

## 2024-02-05 DIAGNOSIS — I10 Essential (primary) hypertension: Secondary | ICD-10-CM

## 2024-02-05 NOTE — Telephone Encounter (Signed)
 Christy NTBS in June for 6 mos FU RF sent to pharmacy

## 2024-02-05 NOTE — Telephone Encounter (Signed)
 Appt scheduled for 03/11/2024

## 2024-03-11 ENCOUNTER — Ambulatory Visit: Payer: Self-pay | Admitting: Family

## 2024-03-11 ENCOUNTER — Encounter: Payer: Self-pay | Admitting: Family

## 2024-03-11 VITALS — BP 138/86 | HR 75 | Temp 97.0°F | Ht 72.0 in | Wt 261.7 lb

## 2024-03-11 DIAGNOSIS — Z1211 Encounter for screening for malignant neoplasm of colon: Secondary | ICD-10-CM

## 2024-03-11 DIAGNOSIS — K760 Fatty (change of) liver, not elsewhere classified: Secondary | ICD-10-CM

## 2024-03-11 DIAGNOSIS — Z0001 Encounter for general adult medical examination with abnormal findings: Secondary | ICD-10-CM | POA: Diagnosis not present

## 2024-03-11 DIAGNOSIS — Z0000 Encounter for general adult medical examination without abnormal findings: Secondary | ICD-10-CM

## 2024-03-11 DIAGNOSIS — I10 Essential (primary) hypertension: Secondary | ICD-10-CM | POA: Diagnosis not present

## 2024-03-11 DIAGNOSIS — K219 Gastro-esophageal reflux disease without esophagitis: Secondary | ICD-10-CM

## 2024-03-11 DIAGNOSIS — Z6835 Body mass index (BMI) 35.0-35.9, adult: Secondary | ICD-10-CM

## 2024-03-11 DIAGNOSIS — R7303 Prediabetes: Secondary | ICD-10-CM

## 2024-03-11 DIAGNOSIS — E785 Hyperlipidemia, unspecified: Secondary | ICD-10-CM

## 2024-03-11 LAB — LIPID PANEL

## 2024-03-11 MED ORDER — ROSUVASTATIN CALCIUM 10 MG PO TABS: 10.0000 mg | ORAL_TABLET | Freq: Every day | ORAL | 3 refills | Status: DC

## 2024-03-11 NOTE — Progress Notes (Signed)
 Subjective:    Patient ID: Adrian Chaney, male    DOB: 10/21/1960, 63 y.o.   MRN: [id number]  Chief Complaint  Patient presents with   Medical Management of Chronic Issues   Pt presents to the office today for CPE and  chronic follow up. Pt is morbid obese with BMI 35 with co morbidity of HTN and GERD.    He has fatty liver. He tries to eat a low fat diet at times.  Drinks a beer rarely.  Hypertension This is a chronic problem. The current episode started more than 1 year ago. The problem has been resolved since onset. The problem is controlled. Pertinent negatives include no blurred vision, malaise/fatigue, peripheral edema or shortness of breath. Risk factors for coronary artery disease include obesity, dyslipidemia and male gender. The current treatment provides moderate improvement.  Gastroesophageal Reflux He complains of belching and heartburn. This is a chronic problem. The current episode started more than 1 year ago. The problem occurs occasionally. The symptoms are aggravated by certain foods. Risk factors include obesity and smoking/tobacco exposure. He has tried a PPI for the symptoms. The treatment provided moderate relief.  Hyperlipidemia This is a chronic problem. The current episode started more than 1 year ago. The problem is uncontrolled. Recent lipid tests were reviewed and are high. Exacerbating diseases include obesity. Pertinent negatives include no shortness of breath. Current antihyperlipidemic treatment includes diet change. The current treatment provides moderate improvement of lipids. Risk factors for coronary artery disease include dyslipidemia, hypertension, a sedentary lifestyle, male sex and obesity.  Diabetes He presents for his follow-up diabetic visit. Diabetes type: prediabtic. Pertinent negatives for diabetes include no blurred vision and no foot paresthesias. Symptoms are stable. Risk factors for coronary artery disease include diabetes mellitus, hypertension  and sedentary lifestyle. He is following a generally healthy diet.      Review of Systems  Constitutional:  Negative for malaise/fatigue.  Eyes:  Negative for blurred vision.  Respiratory:  Negative for shortness of breath.   Gastrointestinal:  Positive for heartburn.  All other systems reviewed and are negative.  Family History  Problem Relation Age of Onset   Hypertension Mother    Diabetes Father    Cancer Father        colon   Colon cancer Father    Scoliosis Brother    Esophageal cancer Neg Hx    Rectal cancer Neg Hx    Stomach cancer Neg Hx    Social History   Socioeconomic History   Marital status: Single    Spouse name: Not on file   Number of children: Not on file   Years of education: Not on file   Highest education level: Not on file  Occupational History   Not on file  Tobacco Use   Smoking status: Former   Smokeless tobacco: Never  Vaping Use   Vaping status: Never Used  Substance and Sexual Activity   Alcohol use: Not Currently    Alcohol/week: 1.0 - 2.0 standard drink of alcohol    Types: 1 - 2 Cans of beer per week   Drug use: Yes    Frequency: 1.0 times per week    Types: Marijuana    Comment: 1 joint a week.    Sexual activity: Not on file  Other Topics Concern   Not on file  Social History Narrative   Not on file   Social Drivers of Health   Financial Resource Strain: Not on file  Food  Insecurity: Not on file  Transportation Needs: Not on file  Physical Activity: Not on file  Stress: Not on file  Social Connections: Not on file       Objective:   Physical Exam Vitals reviewed.  Constitutional:      General: He is not in acute distress.    Appearance: He is well-developed. He is obese.  HENT:     Head: Normocephalic.     Right Ear: Tympanic membrane normal.     Left Ear: Tympanic membrane normal.  Eyes:     General:        Right eye: No discharge.        Left eye: No discharge.     Pupils: Pupils are equal, round, and  reactive to light.  Neck:     Thyroid: No thyromegaly.  Cardiovascular:     Rate and Rhythm: Normal rate and regular rhythm.     Heart sounds: Normal heart sounds. No murmur heard. Pulmonary:     Effort: Pulmonary effort is normal. No respiratory distress.     Breath sounds: Normal breath sounds. No wheezing.  Abdominal:     General: Bowel sounds are normal. There is no distension.     Palpations: Abdomen is soft.     Tenderness: There is no abdominal tenderness.  Musculoskeletal:        General: No tenderness. Normal range of motion.     Cervical back: Normal range of motion and neck supple.  Skin:    General: Skin is warm and dry.     Findings: No erythema or rash.  Neurological:     Mental Status: He is alert and oriented to person, place, and time.     Cranial Nerves: No cranial nerve deficit.     Deep Tendon Reflexes: Reflexes are normal and symmetric.  Psychiatric:        Behavior: Behavior normal.        Thought Content: Thought content normal.        Judgment: Judgment normal.       BP 138/86   Pulse 75   Temp (!) 97 F (36.1 C) (Temporal)   Ht 6' (1.829 m)   Wt 261 lb 11.2 oz (118.7 kg)   SpO2 94%   BMI 35.49 kg/m      Assessment & Plan:  Adrian Chaney comes in today with chief complaint of Medical Management of Chronic Issues   Diagnosis and orders addressed:  1. Annual physical exam (Primary) - Ambulatory referral to Gastroenterology - Bayer DCA Hb A1c Waived - CMP14+EGFR - CBC with Differential/Platelet - Lipid panel - PSA, total and free  2. Essential hypertension - CMP14+EGFR - CBC with Differential/Platelet  3. Morbid obesity (HCC)  - CMP14+EGFR - CBC with Differential/Platelet  4. Gastroesophageal reflux disease, unspecified whether esophagitis present - CMP14+EGFR - CBC with Differential/Platelet  5. Hyperlipidemia, unspecified hyperlipidemia type Start crestor  every other day, if still having pain take M,W,F Low fat diet  -  CMP14+EGFR - CBC with Differential/Platelet - Lipid panel - rosuvastatin  (CRESTOR ) 10 MG tablet; Take 1 tablet (10 mg total) by mouth daily.  Dispense: 90 tablet; Refill: 3  6. Fatty liver - CMP14+EGFR - CBC with Differential/Platelet  7. Prediabetes - Bayer DCA Hb A1c Waived - CMP14+EGFR - CBC with Differential/Platelet  8. Colon cancer screening - Ambulatory referral to Gastroenterology - CMP14+EGFR - CBC with Differential/Platelet   Labs pending Health Maintenance reviewed Diet and exercise encouraged  Follow up plan: 6  months    Bari Learn, FNP

## 2024-03-11 NOTE — Patient Instructions (Signed)
 Health Maintenance, Male  Adopting a healthy lifestyle and getting preventive care are important in promoting health and wellness. Ask your health care provider about:  The right schedule for you to have regular tests and exams.  Things you can do on your own to prevent diseases and keep yourself healthy.  What should I know about diet, weight, and exercise?  Eat a healthy diet    Eat a diet that includes plenty of vegetables, fruits, low-fat dairy products, and lean protein.  Do not eat a lot of foods that are high in solid fats, added sugars, or sodium.  Maintain a healthy weight  Body mass index (BMI) is a measurement that can be used to identify possible weight problems. It estimates body fat based on height and weight. Your health care provider can help determine your BMI and help you achieve or maintain a healthy weight.  Get regular exercise  Get regular exercise. This is one of the most important things you can do for your health. Most adults should:  Exercise for at least 150 minutes each week. The exercise should increase your heart rate and make you sweat (moderate-intensity exercise).  Do strengthening exercises at least twice a week. This is in addition to the moderate-intensity exercise.  Spend less time sitting. Even light physical activity can be beneficial.  Watch cholesterol and blood lipids  Have your blood tested for lipids and cholesterol at 62 years of age, then have this test every 5 years.  You may need to have your cholesterol levels checked more often if:  Your lipid or cholesterol levels are high.  You are older than 61 years of age.  You are at high risk for heart disease.  What should I know about cancer screening?  Many types of cancers can be detected early and may often be prevented. Depending on your health history and family history, you may need to have cancer screening at various ages. This may include screening for:  Colorectal cancer.  Prostate cancer.  Skin cancer.  Lung  cancer.  What should I know about heart disease, diabetes, and high blood pressure?  Blood pressure and heart disease  High blood pressure causes heart disease and increases the risk of stroke. This is more likely to develop in people who have high blood pressure readings or are overweight.  Talk with your health care provider about your target blood pressure readings.  Have your blood pressure checked:  Every 3-5 years if you are 9-95 years of age.  Every year if you are 85 years old or older.  If you are between the ages of 29 and 29 and are a current or former smoker, ask your health care provider if you should have a one-time screening for abdominal aortic aneurysm (AAA).  Diabetes  Have regular diabetes screenings. This checks your fasting blood sugar level. Have the screening done:  Once every three years after age 23 if you are at a normal weight and have a low risk for diabetes.  More often and at a younger age if you are overweight or have a high risk for diabetes.  What should I know about preventing infection?  Hepatitis B  If you have a higher risk for hepatitis B, you should be screened for this virus. Talk with your health care provider to find out if you are at risk for hepatitis B infection.  Hepatitis C  Blood testing is recommended for:  Everyone born from [birth year] through 1964.  Anyone  with known risk factors for hepatitis C.  Sexually transmitted infections (STIs)  You should be screened each year for STIs, including gonorrhea and chlamydia, if:  You are sexually active and are younger than 62 years of age.  You are older than 62 years of age and your health care provider tells you that you are at risk for this type of infection.  Your sexual activity has changed since you were last screened, and you are at increased risk for chlamydia or gonorrhea. Ask your health care provider if you are at risk.  Ask your health care provider about whether you are at high risk for HIV. Your health care provider  may recommend a prescription medicine to help prevent HIV infection. If you choose to take medicine to prevent HIV, you should first get tested for HIV. You should then be tested every 3 months for as long as you are taking the medicine.  Follow these instructions at home:  Alcohol use  Do not drink alcohol if your health care provider tells you not to drink.  If you drink alcohol:  Limit how much you have to 0-2 drinks a day.  Know how much alcohol is in your drink. In the [place] one drink equals one 12 oz bottle of beer (355 mL), one 5 oz glass of wine (148 mL), or one 1 oz glass of hard liquor (44 mL).  Lifestyle  Do not use any products that contain nicotine or tobacco. These products include cigarettes, chewing tobacco, and vaping devices, such as e-cigarettes. If you need help quitting, ask your health care provider.  Do not use street drugs.  Do not share needles.  Ask your health care provider for help if you need support or information about quitting drugs.  General instructions  Schedule regular health, dental, and eye exams.  Stay current with your vaccines.  Tell your health care provider if:  You often feel depressed.  You have ever been abused or do not feel safe at home.  Summary  Adopting a healthy lifestyle and getting preventive care are important in promoting health and wellness.  Follow your health care provider's instructions about healthy diet, exercising, and getting tested or screened for diseases.  Follow your health care provider's instructions on monitoring your cholesterol and blood pressure.  This information is not intended to replace advice given to you by your health care provider. Make sure you discuss any questions you have with your health care provider.  Document Revised: 01/15/2021 Document Reviewed: 01/15/2021  Elsevier Patient Education  2024 [employer].

## 2024-03-12 LAB — CBC WITH DIFFERENTIAL/PLATELET
Basophils Absolute: 0.1 x10E3/uL (ref 0.0–0.2)
Basos: 1 %
EOS (ABSOLUTE): 0.2 x10E3/uL (ref 0.0–0.4)
Eos: 2 %
Hematocrit: 46.3 % (ref 37.5–51.0)
Hemoglobin: 15.9 g/dL (ref 13.0–17.7)
Immature Grans (Abs): 0 x10E3/uL (ref 0.0–0.1)
Immature Granulocytes: 0 %
Lymphocytes Absolute: 2 x10E3/uL (ref 0.7–3.1)
Lymphs: 25 %
MCH: 31.5 pg (ref 26.6–33.0)
MCHC: 34.3 g/dL (ref 31.5–35.7)
MCV: 92 fL (ref 79–97)
Monocytes Absolute: 0.6 x10E3/uL (ref 0.1–0.9)
Monocytes: 8 %
Neutrophils Absolute: 5 x10E3/uL (ref 1.4–7.0)
Neutrophils: 64 %
Platelets: 212 x10E3/uL (ref 150–450)
RBC: 5.05 x10E6/uL (ref 4.14–5.80)
RDW: 12.5 % (ref 11.6–15.4)
WBC: 7.9 x10E3/uL (ref 3.4–10.8)

## 2024-03-12 LAB — CMP14+EGFR
ALT: 57 IU/L — ABNORMAL HIGH (ref 0–44)
AST: 36 IU/L (ref 0–40)
Albumin: 4.5 g/dL (ref 3.9–4.9)
Alkaline Phosphatase: 65 IU/L (ref 44–121)
BUN/Creatinine Ratio: 16 (ref 10–24)
BUN: 19 mg/dL (ref 8–27)
Bilirubin Total: 0.4 mg/dL (ref 0.0–1.2)
CO2: 21 mmol/L (ref 20–29)
Calcium: 9.7 mg/dL (ref 8.6–10.2)
Chloride: 99 mmol/L (ref 96–106)
Creatinine, Ser: 1.17 mg/dL (ref 0.76–1.27)
Globulin, Total: 2.5 g/dL (ref 1.5–4.5)
Glucose: 148 mg/dL — ABNORMAL HIGH (ref 70–99)
Potassium: 3.8 mmol/L (ref 3.5–5.2)
Sodium: 139 mmol/L (ref 134–144)
Total Protein: 7 g/dL (ref 6.0–8.5)
eGFR: 71 mL/min/1.73 (ref >59)

## 2024-03-12 LAB — LIPID PANEL
Chol/HDL Ratio: 6.4 ratio — ABNORMAL HIGH (ref 0.0–5.0)
Cholesterol, Total: 185 mg/dL (ref 100–199)
HDL: 29 mg/dL — ABNORMAL LOW (ref >39)
LDL Chol Calc (NIH): 107 mg/dL — ABNORMAL HIGH (ref 0–99)
Triglycerides: 281 mg/dL — ABNORMAL HIGH (ref 0–149)
VLDL Cholesterol Cal: 49 mg/dL — ABNORMAL HIGH (ref 5–40)

## 2024-03-12 LAB — PSA, TOTAL AND FREE
PSA, Free Pct: 27.6 %
PSA, Free: 0.58 ng/mL
Prostate Specific Ag, Serum: 2.1 ng/mL (ref 0.0–4.0)

## 2024-03-15 ENCOUNTER — Ambulatory Visit: Payer: Self-pay | Admitting: Family

## 2024-03-15 LAB — BAYER DCA HB A1C WAIVED: HB A1C (BAYER DCA - WAIVED): 6.5 % — ABNORMAL HIGH (ref 4.8–5.6)

## 2024-04-26 ENCOUNTER — Encounter: Payer: Self-pay | Admitting: Family

## 2024-05-09 ENCOUNTER — Other Ambulatory Visit: Payer: Self-pay | Admitting: Family

## 2024-05-09 DIAGNOSIS — I10 Essential (primary) hypertension: Secondary | ICD-10-CM

## 2024-06-30 ENCOUNTER — Encounter: Payer: Self-pay | Admitting: *Deleted

## 2024-09-13 ENCOUNTER — Ambulatory Visit: Admitting: Family

## 2024-09-21 ENCOUNTER — Ambulatory Visit: Payer: Self-pay | Admitting: Family

## 2024-09-21 ENCOUNTER — Encounter: Payer: Self-pay | Admitting: Family

## 2024-09-21 VITALS — BP 139/87 | HR 93 | Temp 97.8°F | Ht 72.0 in | Wt 265.4 lb

## 2024-09-21 DIAGNOSIS — I10 Essential (primary) hypertension: Secondary | ICD-10-CM | POA: Diagnosis not present

## 2024-09-21 DIAGNOSIS — K760 Fatty (change of) liver, not elsewhere classified: Secondary | ICD-10-CM | POA: Diagnosis not present

## 2024-09-21 DIAGNOSIS — R7303 Prediabetes: Secondary | ICD-10-CM | POA: Diagnosis not present

## 2024-09-21 DIAGNOSIS — E785 Hyperlipidemia, unspecified: Secondary | ICD-10-CM

## 2024-09-21 DIAGNOSIS — K219 Gastro-esophageal reflux disease without esophagitis: Secondary | ICD-10-CM

## 2024-09-21 DIAGNOSIS — Z1211 Encounter for screening for malignant neoplasm of colon: Secondary | ICD-10-CM

## 2024-09-21 LAB — BAYER DCA HB A1C WAIVED: HB A1C (BAYER DCA - WAIVED): 6.3 % — ABNORMAL HIGH (ref 4.8–5.6)

## 2024-09-21 MED ORDER — ROSUVASTATIN CALCIUM 10 MG PO TABS: 10.0000 mg | ORAL_TABLET | Freq: Every day | ORAL | 3 refills | Status: AC

## 2024-09-21 MED ORDER — LISINOPRIL 40 MG PO TABS: 40.0000 mg | ORAL_TABLET | Freq: Every day | ORAL | 1 refills | Status: AC

## 2024-09-21 MED ORDER — HYDROCHLOROTHIAZIDE 25 MG PO TABS: 25.0000 mg | ORAL_TABLET | Freq: Every day | ORAL | 1 refills | Status: AC

## 2024-09-21 NOTE — Patient Instructions (Signed)
 Dyslipidemia Dyslipidemia is an imbalance of waxy, fat-like substances (lipids) in the blood. The body needs lipids in small amounts. Dyslipidemia often involves a high level of cholesterol or triglycerides, which are types of lipids. Common forms of dyslipidemia include: High levels of LDL cholesterol. LDL is the type of cholesterol that causes fatty deposits (plaques) to build up in the blood vessels that carry blood away from the heart (arteries). Low levels of HDL cholesterol. HDL cholesterol is the type of cholesterol that protects against heart disease. High levels of HDL remove the LDL buildup from arteries. High levels of triglycerides. Triglycerides are a fatty substance in the blood that is linked to a buildup of plaques in the arteries. What are the causes? There are two main types of dyslipidemia: primary and secondary. Primary dyslipidemia is caused by changes (mutations) in genes that are passed down through families (inherited). These mutations cause several types of dyslipidemia. Secondary dyslipidemia may be caused by various risk factors that can lead to the disease, such as lifestyle choices and certain medical conditions. What increases the risk? You are more likely to develop this condition if you are an older man or if you are a woman who has gone through menopause. Other risk factors include: Having a family history of dyslipidemia. Taking certain medicines, including birth control pills, steroids, some diuretics, and beta-blockers. Eating a diet high in saturated fat. Smoking cigarettes or excessive alcohol intake. Having certain medical conditions such as diabetes, polycystic ovary syndrome (PCOS), kidney disease, liver disease, or hypothyroidism. Not exercising regularly. Being overweight or obese with too much belly fat. What are the signs or symptoms? In most cases, dyslipidemia does not usually cause any symptoms. In severe cases, very high lipid levels can  cause: Fatty bumps under the skin (xanthomas). A white or gray ring around the black center (pupil) of the eye. Very high triglyceride levels can cause inflammation of the pancreas (pancreatitis). How is this diagnosed? Your health care provider may diagnose dyslipidemia based on a routine blood test (fasting blood test). Because most people do not have symptoms of the condition, this blood testing (lipid profile) is done on adults age 63 and older and is repeated every 4-6 years. This test checks: Total cholesterol. This measures the total amount of cholesterol in your blood, including LDL cholesterol, HDL cholesterol, and triglycerides. A healthy number is below 200 mg/dL (4.82 mmol/L). LDL cholesterol. The target number for LDL cholesterol is different for each person, depending on individual risk factors. A healthy number is usually below 100 mg/dL (7.40 mmol/L). Ask your health care provider what your LDL cholesterol should be. HDL cholesterol. An HDL level of 60 mg/dL (8.44 mmol/L) or higher is best because it helps to protect against heart disease. A number below 40 mg/dL (8.96 mmol/L) for men or below 50 mg/dL (8.70 mmol/L) for women increases the risk for heart disease. Triglycerides. A healthy triglyceride number is below 150 mg/dL (8.30 mmol/L). If your lipid profile is abnormal, your health care provider may do other blood tests. How is this treated? Treatment depends on the type of dyslipidemia that you have and your other risk factors for heart disease and stroke. Your health care provider will have a target range for your lipid levels based on this information. Treatment for dyslipidemia starts with lifestyle changes, such as diet and exercise. Your health care provider may recommend that you: Get regular exercise. Make changes to your diet. Quit smoking if you smoke. Limit your alcohol intake. If diet  changes and exercise do not help you reach your goals, your health care provider  may also prescribe medicine to lower lipids. The most commonly prescribed type of medicine lowers your LDL cholesterol (statin drug). If you have a high triglyceride level, your provider may prescribe another type of drug (fibrate) or an omega-3 fish oil supplement, or both. Follow these instructions at home: Eating and drinking  Follow instructions from your health care provider or dietitian about eating or drinking restrictions. Eat a healthy diet as told by your health care provider. This can help you reach and maintain a healthy weight, lower your LDL cholesterol, and raise your HDL cholesterol. This may include: Limiting your calories, if you are overweight. Eating more fruits, vegetables, whole grains, fish, and lean meats. Limiting saturated fat, trans fat, and cholesterol. Do not drink alcohol if: Your health care provider tells you not to drink. You are pregnant, may be pregnant, or are planning to become pregnant. If you drink alcohol: Limit how much you have to: 0-1 drink a day for women. 0-2 drinks a day for men. Know how much alcohol is in your drink. In the [place] one drink equals one 12 oz bottle of beer (355 mL), one 5 oz glass of wine (148 mL), or one 1 oz glass of hard liquor (44 mL). Activity Get regular exercise. Start an exercise and strength training program as told by your health care provider. Ask your health care provider what activities are safe for you. Your health care provider may recommend: 30 minutes of aerobic activity 4-6 days a week. Brisk walking is an example of aerobic activity. Strength training 2 days a week. General instructions Do not use any products that contain nicotine or tobacco. These products include cigarettes, chewing tobacco, and vaping devices, such as e-cigarettes. If you need help quitting, ask your health care provider. Take over-the-counter and prescription medicines only as told by your health care provider. This includes  supplements. Keep all follow-up visits. This is important. Contact a health care provider if: You are having trouble sticking to your exercise or diet plan. You are struggling to quit smoking or to control your use of alcohol. Summary Dyslipidemia often involves a high level of cholesterol or triglycerides, which are types of lipids. Treatment depends on the type of dyslipidemia that you have and your other risk factors for heart disease and stroke. Treatment for dyslipidemia starts with lifestyle changes, such as diet and exercise. Your health care provider may prescribe medicine to lower lipids. This information is not intended to replace advice given to you by your health care provider. Make sure you discuss any questions you have with your health care provider. Document Revised: 03/29/2022 Document Reviewed: 10/30/2020 Elsevier Patient Education  2025 [employer].

## 2024-09-21 NOTE — Progress Notes (Signed)
 "  Subjective:    Patient ID: Adrian Chaney, male    DOB: [birth year], 64 y.o.   MRN: [id number]  Chief Complaint  Patient presents with   Medical Management of Chronic Issues   Pt presents to the office today for chronic follow up.  Pt is morbid obese with BMI 35 with co morbidity of HTN and GERD.    He has fatty liver. He tries to eat a low fat diet at times.  Drinks a beer rarely.  Hypertension This is a chronic problem. The current episode started more than 1 year ago. The problem has been resolved since onset. The problem is controlled. Associated symptoms include malaise/fatigue. Pertinent negatives include no blurred vision, peripheral edema or shortness of breath. Risk factors for coronary artery disease include obesity, dyslipidemia and male gender. The current treatment provides moderate improvement.  Gastroesophageal Reflux He complains of belching and heartburn. This is a chronic problem. The current episode started more than 1 year ago. The problem occurs rarely. The symptoms are aggravated by certain foods. Risk factors include obesity and smoking/tobacco exposure. He has tried a PPI for the symptoms. The treatment provided moderate relief.  Hyperlipidemia This is a chronic problem. The current episode started more than 1 year ago. The problem is uncontrolled. Recent lipid tests were reviewed and are high. Exacerbating diseases include obesity. Pertinent negatives include no shortness of breath. Current antihyperlipidemic treatment includes diet change. The current treatment provides mild improvement of lipids. Risk factors for coronary artery disease include dyslipidemia, hypertension, a sedentary lifestyle, male sex and obesity.  Diabetes He presents for his follow-up diabetic visit. Diabetes type: prediabtic. Pertinent negatives for diabetes include no blurred vision and no foot paresthesias. Symptoms are stable. Risk factors for coronary artery disease include diabetes mellitus,  sedentary lifestyle, obesity, male sex and hypertension. He is following a generally healthy diet.      Review of Systems  Constitutional:  Positive for malaise/fatigue.  Eyes:  Negative for blurred vision.  Respiratory:  Negative for shortness of breath.   Gastrointestinal:  Positive for heartburn.  All other systems reviewed and are negative.  Family History  Problem Relation Age of Onset   Hypertension Mother    Diabetes Father    Cancer Father        colon   Colon cancer Father    Scoliosis Brother    Esophageal cancer Neg Hx    Rectal cancer Neg Hx    Stomach cancer Neg Hx    Social History   Socioeconomic History   Marital status: Single    Spouse name: Not on file   Number of children: Not on file   Years of education: Not on file   Highest education level: Not on file  Occupational History   Not on file  Tobacco Use   Smoking status: Former   Smokeless tobacco: Never  Vaping Use   Vaping status: Never Used  Substance and Sexual Activity   Alcohol use: Not Currently    Alcohol/week: 1.0 - 2.0 standard drink of alcohol    Types: 1 - 2 Cans of beer per week   Drug use: Yes    Frequency: 1.0 times per week    Types: Marijuana    Comment: 1 joint a week.    Sexual activity: Not on file  Other Topics Concern   Not on file  Social History Narrative   Not on file   Social Drivers of Health   Tobacco Use: Medium  Risk (09/21/2024)   Patient History    Smoking Tobacco Use: Former    Smokeless Tobacco Use: Never    Passive Exposure: Not on Actuary Strain: Not on file  Food Insecurity: Not on file  Transportation Needs: Not on file  Physical Activity: Not on file  Stress: Not on file  Social Connections: Not on file  Depression (PHQ2-9): Low Risk (09/21/2024)   Depression (PHQ2-9)    PHQ-2 Score: 0  Alcohol Screen: Not on file  Housing: Not on file  Utilities: Not on file  Health Literacy: Not on file       Objective:   Physical  Exam Vitals reviewed.  Constitutional:      General: He is not in acute distress.    Appearance: He is well-developed. He is obese.  HENT:     Head: Normocephalic.     Right Ear: Tympanic membrane normal.     Left Ear: Tympanic membrane normal.  Eyes:     General:        Right eye: No discharge.        Left eye: No discharge.     Pupils: Pupils are equal, round, and reactive to light.  Neck:     Thyroid: No thyromegaly.  Cardiovascular:     Rate and Rhythm: Normal rate and regular rhythm.     Heart sounds: Normal heart sounds. No murmur heard. Pulmonary:     Effort: Pulmonary effort is normal. No respiratory distress.     Breath sounds: Normal breath sounds. No wheezing.  Abdominal:     General: Bowel sounds are normal. There is no distension.     Palpations: Abdomen is soft.     Tenderness: There is no abdominal tenderness.  Musculoskeletal:        General: No tenderness. Normal range of motion.     Cervical back: Normal range of motion and neck supple.  Skin:    General: Skin is warm and dry.     Findings: No erythema or rash.  Neurological:     Mental Status: He is alert and oriented to person, place, and time.     Cranial Nerves: No cranial nerve deficit.     Deep Tendon Reflexes: Reflexes are normal and symmetric.  Psychiatric:        Behavior: Behavior normal.        Thought Content: Thought content normal.        Judgment: Judgment normal.       BP (!) 143/85   Pulse 93   Temp 97.8 F (36.6 C) (Temporal)   Ht 6' (1.829 m)   Wt 265 lb 6.4 oz (120.4 kg)   SpO2 96%   BMI 35.99 kg/m      Assessment & Plan:  Adrian Chaney comes in today with chief complaint of Medical Management of Chronic Issues   Diagnosis and orders addressed:  1. Essential hypertension (Primary) - hydrochlorothiazide  (HYDRODIURIL ) 25 MG tablet; Take 1 tablet (25 mg total) by mouth daily.  Dispense: 90 tablet; Refill: 1 - lisinopril  (ZESTRIL ) 40 MG tablet; Take 1 tablet (40 mg  total) by mouth daily.  Dispense: 90 tablet; Refill: 1 - CMP14+EGFR - CBC with Differential/Platelet  2. Hyperlipidemia, unspecified hyperlipidemia type Pt will restart Crestor , but taking every other day or MWF - rosuvastatin  (CRESTOR ) 10 MG tablet; Take 1 tablet (10 mg total) by mouth daily.  Dispense: 90 tablet; Refill: 3 - CMP14+EGFR - CBC with Differential/Platelet  3. Fatty liver -  CMP14+EGFR - CBC with Differential/Platelet  4. Gastroesophageal reflux disease, unspecified whether esophagitis present - CMP14+EGFR - CBC with Differential/Platelet  5. Morbid obesity (HCC) - CMP14+EGFR - CBC with Differential/Platelet  6. Prediabetes - CMP14+EGFR - CBC with Differential/Platelet - Bayer DCA Hb A1c Waived  7. Colon cancer screening - Ambulatory referral to Gastroenterology - CMP14+EGFR - CBC with Differential/Platelet    Labs pending Continue current medications  Health Maintenance reviewed Diet and exercise encouraged  Follow up plan: 6 months    Bari Learn, FNP   "

## 2024-09-22 LAB — CMP14+EGFR
ALT: 51 IU/L — AB (ref 0–44)
AST: 29 IU/L (ref 0–40)
Albumin: 4.6 g/dL (ref 3.9–4.9)
Alkaline Phosphatase: 60 IU/L (ref 47–123)
BUN/Creatinine Ratio: 14 (ref 10–24)
BUN: 16 mg/dL (ref 8–27)
Bilirubin Total: 0.4 mg/dL (ref 0.0–1.2)
CO2: 20 mmol/L (ref 20–29)
Calcium: 9.4 mg/dL (ref 8.6–10.2)
Chloride: 101 mmol/L (ref 96–106)
Creatinine, Ser: 1.15 mg/dL (ref 0.76–1.27)
Globulin, Total: 2.4 g/dL (ref 1.5–4.5)
Glucose: 159 mg/dL — AB (ref 70–99)
Potassium: 4 mmol/L (ref 3.5–5.2)
Sodium: 139 mmol/L (ref 134–144)
Total Protein: 7 g/dL (ref 6.0–8.5)
eGFR: 72 mL/min/1.73

## 2024-09-22 LAB — CBC WITH DIFFERENTIAL/PLATELET
Basophils Absolute: 0.1 x10E3/uL (ref 0.0–0.2)
Basos: 1 %
EOS (ABSOLUTE): 0.2 x10E3/uL (ref 0.0–0.4)
Eos: 2 %
Hematocrit: 46.9 % (ref 37.5–51.0)
Hemoglobin: 15.8 g/dL (ref 13.0–17.7)
Immature Grans (Abs): 0 x10E3/uL (ref 0.0–0.1)
Immature Granulocytes: 0 %
Lymphocytes Absolute: 1.8 x10E3/uL (ref 0.7–3.1)
Lymphs: 23 %
MCH: 31 pg (ref 26.6–33.0)
MCHC: 33.7 g/dL (ref 31.5–35.7)
MCV: 92 fL (ref 79–97)
Monocytes Absolute: 0.6 x10E3/uL (ref 0.1–0.9)
Monocytes: 8 %
Neutrophils Absolute: 5.2 x10E3/uL (ref 1.4–7.0)
Neutrophils: 66 %
Platelets: 206 x10E3/uL (ref 150–450)
RBC: 5.1 x10E6/uL (ref 4.14–5.80)
RDW: 12.3 % (ref 11.6–15.4)
WBC: 7.8 x10E3/uL (ref 3.4–10.8)

## 2024-09-23 ENCOUNTER — Ambulatory Visit: Payer: Self-pay | Admitting: Family

## 2025-03-22 ENCOUNTER — Ambulatory Visit: Admitting: Family
